# Patient Record
Sex: Female | Born: 1970 | Race: White | Hispanic: No | Smoking: Never smoker
Health system: Southern US, Community
[De-identification: ages and names within clinical notes are randomized; demographics above are authoritative.]

## PROBLEM LIST (undated history)

## (undated) DIAGNOSIS — T7840XA Allergy, unspecified, initial encounter: Secondary | ICD-10-CM

## (undated) DIAGNOSIS — R32 Unspecified urinary incontinence: Secondary | ICD-10-CM

## (undated) DIAGNOSIS — N946 Dysmenorrhea, unspecified: Secondary | ICD-10-CM

## (undated) DIAGNOSIS — Z8659 Personal history of other mental and behavioral disorders: Secondary | ICD-10-CM

## (undated) DIAGNOSIS — D219 Benign neoplasm of connective and other soft tissue, unspecified: Secondary | ICD-10-CM

## (undated) DIAGNOSIS — F419 Anxiety disorder, unspecified: Secondary | ICD-10-CM

## (undated) DIAGNOSIS — E349 Endocrine disorder, unspecified: Secondary | ICD-10-CM

## (undated) DIAGNOSIS — A64 Unspecified sexually transmitted disease: Secondary | ICD-10-CM

## (undated) DIAGNOSIS — D649 Anemia, unspecified: Secondary | ICD-10-CM

## (undated) DIAGNOSIS — I471 Supraventricular tachycardia, unspecified: Secondary | ICD-10-CM

## (undated) DIAGNOSIS — R011 Cardiac murmur, unspecified: Secondary | ICD-10-CM

## (undated) HISTORY — DX: Cardiac murmur, unspecified: R01.1

## (undated) HISTORY — PX: NASAL SINUS SURGERY: SHX719

## (undated) HISTORY — DX: Supraventricular tachycardia: I47.1

## (undated) HISTORY — DX: Endocrine disorder, unspecified: E34.9

## (undated) HISTORY — DX: Anxiety disorder, unspecified: F41.9

## (undated) HISTORY — DX: Personal history of other mental and behavioral disorders: Z86.59

## (undated) HISTORY — DX: Supraventricular tachycardia, unspecified: I47.10

## (undated) HISTORY — PX: COSMETIC SURGERY: SHX468

## (undated) HISTORY — DX: Allergy, unspecified, initial encounter: T78.40XA

## (undated) HISTORY — PX: APPENDECTOMY: SHX54

## (undated) HISTORY — DX: Benign neoplasm of connective and other soft tissue, unspecified: D21.9

## (undated) HISTORY — DX: Dysmenorrhea, unspecified: N94.6

## (undated) HISTORY — PX: BREAST REDUCTION SURGERY: SHX8

## (undated) HISTORY — DX: Anemia, unspecified: D64.9

## (undated) HISTORY — DX: Unspecified urinary incontinence: R32

## (undated) HISTORY — PX: BREAST SURGERY: SHX581

## (undated) HISTORY — DX: Unspecified sexually transmitted disease: A64

## (undated) HISTORY — PX: ABDOMINAL HYSTERECTOMY: SHX81

---

## 2005-02-15 ENCOUNTER — Ambulatory Visit: Payer: Self-pay | Admitting: Internal Medicine

## 2005-07-18 ENCOUNTER — Ambulatory Visit (HOSPITAL_BASED_OUTPATIENT_CLINIC_OR_DEPARTMENT_OTHER): Admission: RE | Admit: 2005-07-18 | Discharge: 2005-07-18 | Payer: Self-pay | Admitting: Plastic Surgery

## 2005-07-18 ENCOUNTER — Encounter (INDEPENDENT_AMBULATORY_CARE_PROVIDER_SITE_OTHER): Payer: Self-pay | Admitting: Specialist

## 2009-03-17 ENCOUNTER — Emergency Department (HOSPITAL_COMMUNITY): Admission: EM | Admit: 2009-03-17 | Discharge: 2009-03-17 | Payer: Self-pay | Admitting: Emergency Medicine

## 2010-01-19 ENCOUNTER — Emergency Department (HOSPITAL_COMMUNITY): Admission: EM | Admit: 2010-01-19 | Discharge: 2010-01-19 | Payer: Self-pay | Admitting: Emergency Medicine

## 2010-06-29 ENCOUNTER — Emergency Department (HOSPITAL_COMMUNITY)
Admission: EM | Admit: 2010-06-29 | Discharge: 2010-06-29 | Disposition: A | Discharge status: Home / Self Care | Payer: Self-pay | Attending: Emergency Medicine | Admitting: Emergency Medicine

## 2010-06-29 ENCOUNTER — Emergency Department (HOSPITAL_COMMUNITY): Payer: Self-pay

## 2010-06-29 DIAGNOSIS — I498 Other specified cardiac arrhythmias: Secondary | ICD-10-CM | POA: Insufficient documentation

## 2010-06-29 DIAGNOSIS — F411 Generalized anxiety disorder: Secondary | ICD-10-CM | POA: Insufficient documentation

## 2010-06-29 DIAGNOSIS — F988 Other specified behavioral and emotional disorders with onset usually occurring in childhood and adolescence: Secondary | ICD-10-CM | POA: Insufficient documentation

## 2010-06-29 LAB — URINALYSIS, ROUTINE W REFLEX MICROSCOPIC
Bilirubin Urine: NEGATIVE
Hgb urine dipstick: NEGATIVE
Protein, ur: NEGATIVE mg/dL
Urine Glucose, Fasting: NEGATIVE mg/dL
Urobilinogen, UA: 0.2 mg/dL (ref 0.0–1.0)

## 2010-06-29 LAB — DIFFERENTIAL
Eosinophils Absolute: 0 10*3/uL (ref 0.0–0.7)
Eosinophils Relative: 0 % (ref 0–5)
Lymphocytes Relative: 19 % (ref 12–46)
Lymphs Abs: 1.4 10*3/uL (ref 0.7–4.0)
Monocytes Absolute: 0.5 10*3/uL (ref 0.1–1.0)
Monocytes Relative: 7 % (ref 3–12)

## 2010-06-29 LAB — COMPREHENSIVE METABOLIC PANEL
ALT: 19 U/L (ref 0–35)
AST: 27 U/L (ref 0–37)
Alkaline Phosphatase: 39 U/L (ref 39–117)
CO2: 21 mEq/L (ref 19–32)
Calcium: 9.3 mg/dL (ref 8.4–10.5)
GFR calc Af Amer: >60 mL/min (ref >60)
GFR calc non Af Amer: >60 mL/min (ref >60)
Potassium: 4.4 mEq/L (ref 3.5–5.1)
Sodium: 139 mEq/L (ref 135–145)
Total Protein: 7.3 g/dL (ref 6.0–8.3)

## 2010-06-29 LAB — POCT CARDIAC MARKERS
CKMB, poc: <1 ng/mL — ABNORMAL LOW (ref 1.0–8.0)
Troponin i, poc: <0.05 ng/mL (ref 0.00–0.09)

## 2010-06-29 LAB — CBC
HCT: 41.1 % (ref 36.0–46.0)
MCH: 32.1 pg (ref 26.0–34.0)
MCHC: 34.5 g/dL (ref 30.0–36.0)
MCV: 93 fL (ref 78.0–100.0)
Platelets: 239 10*3/uL (ref 150–400)
RDW: 12.4 % (ref 11.5–15.5)
WBC: 7.7 10*3/uL (ref 4.0–10.5)

## 2010-06-29 LAB — CK TOTAL AND CKMB (NOT AT ARMC): Total CK: 158 U/L (ref 7–177)

## 2010-07-01 LAB — URINE CULTURE: Culture  Setup Time: 201202180112

## 2010-08-15 LAB — POCT I-STAT, CHEM 8
BUN: 10 mg/dL (ref 6–23)
Calcium, Ion: 1.11 mmol/L — ABNORMAL LOW (ref 1.12–1.32)
Creatinine, Ser: 1 mg/dL (ref 0.4–1.2)
TCO2: 19 mmol/L (ref 0–100)

## 2010-09-28 NOTE — Op Note (Signed)
[name], Lindsey Adams              ACCOUNT NO.:  [id number]   MEDICAL RECORD NO.:  [id number]          PATIENT TYPE:  AMB   LOCATION:  DSC                          FACILITY:  [facility]   PHYSICIAN:  Alfredia Ferguson, M.D.  DATE OF BIRTH:  08/03/1970   DATE OF PROCEDURE:  07/18/2005  DATE OF DISCHARGE:                                 OPERATIVE REPORT   PREOP DIAGNOSIS:  1.  2-mm raised nevus nasal tip.  2.  3-mm raised nevus left lateral nasal wall.  3.  3-mm pigmented nevus right axilla.  4.  5-mm pigmented nevus left inframammary crease.  5.  4-mm flat pigmented nevus left inframammary crease.   POSTOP DIAGNOSIS:  1.  2-mm raised nevus nasal tip.  2.  3-mm raised nevus left lateral nasal wall.  3.  3-mm pigmented nevus right axilla.  4.  5-mm pigmented nevus left inframammary crease.  5.  4-mm flat pigmented nevus left inframammary crease.   OPERATION PERFORMED:  Excision of pigmented nevi x5.   SURGEON:  Dr. Benna Dunks.   ANESTHESIA:  2% Xylocaine 1:100,000 epinephrine.   INDICATIONS FOR SURGERY:  This is a 40 year old woman with multiple  pigmented nevi that she wishes to have removed. She understands she is  trading what she has for permanent potentially unsightly scar. In spite that  she wishes to proceed with surgery.   DESCRIPTION OF SURGERY:  Elliptical skin marks were placed around the lesion  on the nasal tip and the left lateral nasal wall. Local anesthesia using 2%  Xylocaine 1:100,000 epinephrine was infiltrated. The nasal area was prepped  with Betadine and draped with sterile drapes. Elliptical excision of the  lesion of the nasal tip was carried out and the specimen submitted for  pathology. Wound edges were undermined for distance of about a millimeter or  2 in all directions. The incision was closed with interrupted 6-0 nylon  sutures. The lesion of the left lateral nasal wall was excised in an  identical fashion and closed in identical fashion. The area was  cleansed and  dried. Attention was turned to the right axillary lesion. Again local  anesthesia was infiltrated. The area was prepped with Betadine. Lesion was  excised in elliptical fashion and submitted for pathology. The wound was  closed with 6-0 nylon suture. The patient had two lesions in her left  inframammary crease. One was a raised pedunculated pigmented nevus and the  other was a flat irregular darkly pigmented nevus. The lesion that was  pedunculated was shaved at the skin level.  The flat lesion was excised an elliptical fashion down the level  subcutaneous tissue. The incision was then closed with interrupted 6-0 nylon  suture. The patient tolerated these procedures well. Light dressings were  applied. The patient was discharged home in satisfactory condition.      Alfredia Ferguson, M.D.  Electronically Signed     WBB/MEDQ  D:  07/18/2005  T:  07/19/2005  Job:  [id number]

## 2012-01-06 ENCOUNTER — Telehealth: Payer: Self-pay

## 2012-01-06 MED ORDER — VALACYCLOVIR HCL 1 G PO TABS: ORAL_TABLET | ORAL | Status: DC

## 2012-01-06 NOTE — Telephone Encounter (Signed)
Patient notified

## 2012-01-06 NOTE — Telephone Encounter (Signed)
Rx authorized

## 2012-01-06 NOTE — Telephone Encounter (Signed)
Ok for rx x 1?

## 2012-01-06 NOTE — Telephone Encounter (Signed)
Pt has scheduled CPE with Maralyn Sago for 9/18, needs 1 refill of Valtrex called into [facility] on [phone]

## 2012-01-15 ENCOUNTER — Encounter: Payer: Self-pay | Admitting: Physician Assistant

## 2012-01-27 ENCOUNTER — Other Ambulatory Visit: Payer: Self-pay | Admitting: Physician Assistant

## 2012-01-29 ENCOUNTER — Encounter: Payer: Self-pay | Admitting: Physician Assistant

## 2012-02-26 ENCOUNTER — Encounter: Payer: Self-pay | Admitting: Physician Assistant

## 2012-03-10 ENCOUNTER — Other Ambulatory Visit: Payer: Self-pay | Admitting: Physician Assistant

## 2012-03-10 NOTE — Telephone Encounter (Signed)
Chart pulled to PA pool at nurses station [phone]

## 2012-03-25 ENCOUNTER — Encounter: Payer: Self-pay | Admitting: Physician Assistant

## 2012-03-30 ENCOUNTER — Other Ambulatory Visit: Payer: Self-pay | Admitting: Physician Assistant

## 2012-04-22 ENCOUNTER — Encounter: Payer: Self-pay | Admitting: Physician Assistant

## 2012-06-03 ENCOUNTER — Encounter: Payer: Self-pay | Admitting: Physician Assistant

## 2012-06-07 ENCOUNTER — Emergency Department (HOSPITAL_COMMUNITY)
Admission: EM | Admit: 2012-06-07 | Discharge: 2012-06-07 | Disposition: A | Discharge status: Home / Self Care | Payer: Self-pay

## 2013-04-06 ENCOUNTER — Other Ambulatory Visit: Payer: Self-pay | Admitting: Physician Assistant

## 2013-04-06 NOTE — Telephone Encounter (Signed)
Patient insists on renewal of valtrex urgent.   [phone]

## 2013-04-07 ENCOUNTER — Other Ambulatory Visit: Payer: Self-pay | Admitting: Physician Assistant

## 2013-04-12 ENCOUNTER — Other Ambulatory Visit: Payer: Self-pay | Admitting: Physician Assistant

## 2013-04-13 ENCOUNTER — Telehealth: Payer: Self-pay | Admitting: Radiology

## 2013-04-13 NOTE — Telephone Encounter (Signed)
Patient advised she needs office visit for refill on her Valtrex. Patient states she called and was given a phone call about the rx but she is angry she was not advised of need for visit. We have done everything properly, denial sent to phramacy. She is very angry. I have told her I will make sure French Ana knows situation. She also wants Benny Lennert to be aware. She wants French Ana to call her.

## 2013-04-14 ENCOUNTER — Other Ambulatory Visit: Payer: Self-pay | Admitting: Physician Assistant

## 2013-04-15 MED ORDER — VALACYCLOVIR HCL 1 G PO TABS: ORAL_TABLET | ORAL | Status: DC

## 2013-04-15 NOTE — Telephone Encounter (Signed)
I will be happy to send her in a month of medication so that will give her time to have an ov.

## 2013-04-15 NOTE — Telephone Encounter (Signed)
lmom detail with note that she will need office visit per sarah before she run out.

## 2013-06-02 ENCOUNTER — Telehealth: Payer: Self-pay

## 2013-06-02 NOTE — Telephone Encounter (Signed)
PT WOULD LIKE A LIST OF HER MEDICINE SHE TAKES SO IT CAN BE REQUESTED FROM ANOTHER DR, WANTED TO MAKE SURE ADDERALL IS ON THE LIST PLEASE CALL [phone]

## 2013-06-04 NOTE — Telephone Encounter (Signed)
LM for pt to contact primary provider. We do not have a complete list of medications. We have never prescribed Adderall.

## 2013-12-11 ENCOUNTER — Telehealth: Payer: Self-pay

## 2013-12-11 NOTE — Telephone Encounter (Signed)
The pt called and wanted [name] to send her in some flagyl for BV. The pt states that she went to another urgent care in [place] and was diagnosed with Bv and got the meds, but wants Weber to send in another script in case she gets it again. If this can be done pt wants the script sent into [facility]. Please call the pt and advise the pt number is [phone].

## 2013-12-11 NOTE — Telephone Encounter (Signed)
We have not seen the patient in 3 years and I cannot send in this Rx I am sorry.

## 2013-12-13 NOTE — Telephone Encounter (Signed)
Lm unable to process request. Pt needs to RTC.

## 2014-08-08 ENCOUNTER — Ambulatory Visit (INDEPENDENT_AMBULATORY_CARE_PROVIDER_SITE_OTHER): Payer: Self-pay | Admitting: Physician Assistant

## 2014-08-08 DIAGNOSIS — F988 Other specified behavioral and emotional disorders with onset usually occurring in childhood and adolescence: Secondary | ICD-10-CM

## 2014-08-08 DIAGNOSIS — Z130 Encounter for screening for diseases of the blood and blood-forming organs and certain disorders involving the immune mechanism: Secondary | ICD-10-CM

## 2014-08-08 DIAGNOSIS — Z1329 Encounter for screening for other suspected endocrine disorder: Secondary | ICD-10-CM

## 2014-08-08 DIAGNOSIS — Z13228 Encounter for screening for other metabolic disorders: Secondary | ICD-10-CM

## 2014-08-08 DIAGNOSIS — Z3041 Encounter for surveillance of contraceptive pills: Secondary | ICD-10-CM

## 2014-08-08 DIAGNOSIS — Z13220 Encounter for screening for lipoid disorders: Secondary | ICD-10-CM

## 2014-08-08 DIAGNOSIS — Z114 Encounter for screening for human immunodeficiency virus [HIV]: Secondary | ICD-10-CM

## 2014-08-08 DIAGNOSIS — I471 Supraventricular tachycardia, unspecified: Secondary | ICD-10-CM

## 2014-08-08 DIAGNOSIS — A6000 Herpesviral infection of urogenital system, unspecified: Secondary | ICD-10-CM

## 2014-08-08 DIAGNOSIS — R21 Rash and other nonspecific skin eruption: Secondary | ICD-10-CM

## 2014-08-08 DIAGNOSIS — F909 Attention-deficit hyperactivity disorder, unspecified type: Secondary | ICD-10-CM

## 2014-08-08 DIAGNOSIS — Z113 Encounter for screening for infections with a predominantly sexual mode of transmission: Secondary | ICD-10-CM

## 2014-08-08 DIAGNOSIS — F411 Generalized anxiety disorder: Secondary | ICD-10-CM

## 2014-08-08 MED ORDER — ATENOLOL 25 MG PO TABS: 25.0000 mg | ORAL_TABLET | Freq: Every day | ORAL | Status: DC

## 2014-08-08 MED ORDER — VALACYCLOVIR HCL 1 G PO TABS: ORAL_TABLET | ORAL | Status: DC

## 2014-08-08 MED ORDER — LEVONORGESTREL-ETHINYL ESTRAD 0.1-20 MG-MCG PO TABS: 1.0000 | ORAL_TABLET | Freq: Every day | ORAL | Status: DC

## 2014-08-08 MED ORDER — TRIAMCINOLONE ACETONIDE 0.5 % EX CREA: 1.0000 "application " | TOPICAL_CREAM | Freq: Two times a day (BID) | CUTANEOUS | Status: DC

## 2014-08-08 MED ORDER — DESOGESTREL-ETHINYL ESTRADIOL 0.15-30 MG-MCG PO TABS: ORAL_TABLET | ORAL | Status: DC

## 2014-08-08 MED ORDER — HYDROCORTISONE 2.5 % RE CREA: 1.0000 "application " | TOPICAL_CREAM | Freq: Two times a day (BID) | RECTAL | Status: DC

## 2014-08-08 NOTE — Patient Instructions (Signed)
I will contact you with your lab results as soon as they are available.   If you have not heard from me in 2 weeks, please contact me.  The fastest way to get your results is to register for My Chart (see the instructions on the last page of this printout).   

## 2014-08-08 NOTE — Progress Notes (Signed)
Subjective:    Patient ID: Lindsey Adams, female    DOB: 1970/10/10, 44 y.o.   MRN: [id number]  HPI Pt presents for medication refill.  She is back home currently to help with her mother whose health is failing.  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  She is doing well.  She needs refills on her OCP (she would like to try something different than Apri at this time) and she would like to have STD testing.  She likes to have it yearly to make sure everything is ok.  She is also having a rash that is very itchy on her back for the last 2 weeks started after a bad sunburn to her back while she was doing an intestinal cleanse.  She has done nothing for the rash but would like it checked because it is starting to bother her.  She has also had several episodes (not currently) where she gets a painful area near her anus - it feels different than her genital herpes and she has tried her Valtrex but it does help with the burning and pain - she also does not get the same prodromal sensation as she does with her HSV.  She uses wet wipes with every BM but she does admit to wiping a lot.  She does not weat underwear and wears tight pants but she does not feel like her pants are rubbing her at the area she gets the pain.  The rash/pain goes on for a few days.  It feel different than her fissures and the diltizem cream makes the area burn - it does not hurt with having a BM but rather hurts when there is pressure on the area.  She does not engage in anal sex.  She Dr Toy Care for her ADD and anxiety.   Review of Systems  Constitutional: Negative.   HENT: Negative.   Gastrointestinal: Negative for diarrhea and constipation.  Genitourinary: Negative for dysuria, vaginal bleeding, vaginal discharge and menstrual problem.  Skin: Positive for rash.  Neurological: Negative.    Patient Active Problem List   Diagnosis Date Noted  . SVT (supraventricular tachycardia)  08/08/2014  . ADD (attention deficit disorder) 08/08/2014  . Generalized anxiety disorder 08/08/2014   Prior to Admission medications   Medication Sig Start Date End Date Taking? Authorizing Provider  ALPRAZolam Duanne Moron) 0.5 MG tablet Take 0.5 mg by mouth 3 (three) times daily as needed for anxiety.   Yes Historical Provider, MD  amphetamine-dextroamphetamine (ADDERALL) 20 MG tablet Take 20 mg by mouth 3 (three) times daily.   Yes Historical Provider, MD  atenolol (TENORMIN) 25 MG tablet Take 1 tablet (25 mg total) by mouth daily. 08/08/14  Yes Mancel Bale, PA-C  valACYclovir (VALTREX) 1000 MG tablet Take 1/2 tablet daily for suppression, then 1/2 tablet twice daily for 3 days as needed for outbreaks 08/08/14  Yes Mancel Bale, PA-C  desogestrel-ethinyl estradiol (APRI) 0.15-30 MG-MCG tablet TAKE 1 TABLET BY MOUTH EVERY DAY AS DIRECTED. 08/08/14   Mancel Bale, PA-C   Allergies  Allergen Reactions  . Sulfa Antibiotics Nausea Only    Medications, allergies, past medical history, surgical history, family history, social history and problem list reviewed and updated.      Objective:   Physical Exam  Constitutional: She is oriented to person, place, and time. She appears well-developed and well-nourished.  BP 114/72 mmHg  Pulse 90  Temp(Src) 97.9 F (36.6 C) (Oral)  Resp 16  Ht 5\' 5"  (1.651 m)  Wt 138 lb 4 oz (62.71 kg)  BMI 23.01 kg/m2  SpO2 100%  LMP 07/26/2014   HENT:  Head: Normocephalic and atraumatic.  Right Ear: External ear normal.  Left Ear: External ear normal.  Eyes: Conjunctivae are normal.  Cardiovascular: Normal rate, regular rhythm and normal heart sounds.   No murmur heard. Pulmonary/Chest: Effort normal and breath sounds normal. She has no wheezes.  Genitourinary: There is no rash, tenderness, lesion or injury on the right labia. There is no rash, tenderness, lesion or injury on the left labia. Cervix exhibits no motion tenderness, no discharge and no  friability.  Bilateral nipple rings.  Clitoris ring present.  Musculoskeletal: Normal range of motion.  Neurological: She is alert and oriented to person, place, and time.  Skin: Skin is warm and dry. Rash noted.  Upper back - signs of peeling skin with macules some of which are unroofed and irritated - there is no signs of 2nd infection.  Anus - just adjacent to the anus there are small (3-79mm) areas of raw tissue without erythema, no drainage from the anus.  There is a raised non-erythematous area where the patient states she gets this painful area that is not part of her anal tissue but at the 7 o'clock position of her perineum without any signs of infection.  Psychiatric: She has a normal mood and affect. Her behavior is normal. Judgment and thought content normal.  Vitals reviewed.     Assessment & Plan:  Family planning, BCP (birth control pills) maintenance - Plan: , levonorgestrel-ethinyl estradiol (AVIANE) 0.1-20 MG-MCG tablet  Rash  Most likely raw from excessive wiping - she will try destin for barrier protection and then the cortisone cream to help it heal- Plan: hydrocortisone (ANUSOL-HC) 2.5 % rectal cream  Rash of back - appears like a contact dermatitis - expect a sun irritatant due to her recent bad sunburnPlan: triamcinolone cream (KENALOG) 0.5 %  Screening for metabolic disorder - Plan: COMPLETE METABOLIC PANEL WITH GFR  Screening for HIV (human immunodeficiency virus) - Plan: HIV antibody  Screening for thyroid disorder - Plan: TSH  Screening cholesterol level - Plan: Lipid panel  Screening for deficiency anemia - Plan: CBC with Differential/Platelet  Screening for STD (sexually transmitted disease) - Plan: POCT urinalysis dipstick, Hepatitis B surface antigen, Hepatitis C Ab Reflex HCV RNA, QUANT, RPR, GC/Chlamydia Probe Amp  SVT (supraventricular tachycardia) - Plan: atenolol (TENORMIN) 25 MG tablet  ADD (attention deficit disorder) - gets medication from Dr  Toy Care  Generalized anxiety disorder - gets her medications from Dr Toy Care  Genital HSV - Plan: valACYclovir (VALTREX) 1000 MG tablet  SVT (supraventricular tachycardia), Inactive - Plan: atenolol (TENORMIN) 25 MG tablet   [name] PA-C  [facility] 08/08/2014 9:37 PM

## 2014-08-09 ENCOUNTER — Encounter: Payer: Self-pay | Admitting: Physician Assistant

## 2014-08-09 LAB — CBC WITH DIFFERENTIAL/PLATELET
BASOS ABS: 0.1 10*3/uL (ref 0.0–0.1)
Basophils Relative: 2 % — ABNORMAL HIGH (ref 0–1)
EOS PCT: 10 % — AB (ref 0–5)
Eosinophils Absolute: 0.6 10*3/uL (ref 0.0–0.7)
HEMATOCRIT: 31.4 % — AB (ref 36.0–46.0)
HEMOGLOBIN: 10 g/dL — AB (ref 12.0–15.0)
LYMPHS ABS: 2.3 10*3/uL (ref 0.7–4.0)
LYMPHS PCT: 36 % (ref 12–46)
MCH: 24.9 pg — AB (ref 26.0–34.0)
MCHC: 31.8 g/dL (ref 30.0–36.0)
MCV: 78.3 fL (ref 78.0–100.0)
MPV: 9 fL (ref 8.6–12.4)
Monocytes Absolute: 0.5 10*3/uL (ref 0.1–1.0)
Monocytes Relative: 8 % (ref 3–12)
Neutro Abs: 2.8 10*3/uL (ref 1.7–7.7)
Neutrophils Relative %: 44 % (ref 43–77)
Platelets: 381 10*3/uL (ref 150–400)
RBC: 4.01 MIL/uL (ref 3.87–5.11)
RDW: 17 % — ABNORMAL HIGH (ref 11.5–15.5)
WBC: 6.4 10*3/uL (ref 4.0–10.5)

## 2014-08-09 LAB — COMPLETE METABOLIC PANEL WITH GFR
ALT: 14 U/L (ref 0–35)
AST: 17 U/L (ref 0–37)
Albumin: 4.6 g/dL (ref 3.5–5.2)
Alkaline Phosphatase: 44 U/L (ref 39–117)
BUN: 4 mg/dL — ABNORMAL LOW (ref 6–23)
CO2: 28 mEq/L (ref 19–32)
Calcium: 9.9 mg/dL (ref 8.4–10.5)
Chloride: 101 mEq/L (ref 96–112)
Creat: 0.71 mg/dL (ref 0.50–1.10)
GFR, Est African American: >89 mL/min
GFR, Est Non African American: >89 mL/min
Glucose, Bld: 61 mg/dL — ABNORMAL LOW (ref 70–99)
Potassium: 3.7 mEq/L (ref 3.5–5.3)
Sodium: 141 mEq/L (ref 135–145)
Total Bilirubin: 0.6 mg/dL (ref 0.2–1.2)
Total Protein: 7.6 g/dL (ref 6.0–8.3)

## 2014-08-09 LAB — LIPID PANEL
Cholesterol: 228 mg/dL — ABNORMAL HIGH (ref 0–200)
HDL: 123 mg/dL (ref >=46)
LDL Cholesterol: 91 mg/dL (ref 0–99)
Total CHOL/HDL Ratio: 1.9 Ratio
Triglycerides: 71 mg/dL (ref <150)
VLDL: 14 mg/dL (ref 0–40)

## 2014-08-09 LAB — HEPATITIS C ANTIBODY: HCV Ab: NEGATIVE

## 2014-08-09 LAB — TSH: TSH: 1.401 u[IU]/mL (ref 0.350–4.500)

## 2014-08-09 LAB — RPR

## 2014-08-09 LAB — HEPATITIS B SURFACE ANTIGEN: Hepatitis B Surface Ag: NEGATIVE

## 2014-08-09 LAB — HIV ANTIBODY (ROUTINE TESTING W REFLEX): HIV 1&2 Ab, 4th Generation: NONREACTIVE

## 2014-08-10 LAB — GC/CHLAMYDIA PROBE AMP
CT PROBE, AMP APTIMA: NEGATIVE
GC PROBE AMP APTIMA: NEGATIVE

## 2014-09-23 ENCOUNTER — Telehealth: Payer: Self-pay

## 2014-09-23 NOTE — Telephone Encounter (Signed)
Patient wants to speak with Bernestine Amass and states this is a serious emergency. Patient states that she has been bleeding a lot from switching birthcontrol and now she has bv. I asked someone from clinical if it was possible to get BV from switching medication and they said no. Patient thinks it could be from a sexual partner but she is sure that she has it. Please call! [phone]

## 2014-09-24 NOTE — Telephone Encounter (Signed)
Pt states she is out of state and travels all the time and cannot come in. She wants to know if you will call her in flagyl with one refill.  She keeps getting these infections after her menstrual cycle.  Vladimir Faster in [place]

## 2014-09-26 NOTE — Telephone Encounter (Signed)
I have pended the medications. Will you clarify the pharmacy please and then send in the Rx.

## 2014-10-05 NOTE — Telephone Encounter (Signed)
Can we try and get in contact with this patient regarding this please.

## 2014-10-05 NOTE — Telephone Encounter (Signed)
LEFT A MESSAGE FOR PATIENT TO CALL BACK WE NEED A PHARMACY TO SEND PRESCRIPTION TO.

## 2014-10-06 MED ORDER — METRONIDAZOLE 500 MG PO TABS: 500.0000 mg | ORAL_TABLET | Freq: Two times a day (BID) | ORAL | Status: DC

## 2014-10-06 NOTE — Telephone Encounter (Signed)
Pt is needing the pill form of flagyl sent to [facility] in [place], [place]

## 2014-10-06 NOTE — Telephone Encounter (Signed)
[facility] Rx sent.

## 2015-02-15 ENCOUNTER — Telehealth: Payer: Self-pay

## 2015-02-15 NOTE — Telephone Encounter (Signed)
Pt is needing a rx for diflucan

## 2015-02-16 MED ORDER — FLUCONAZOLE 150 MG PO TABS: 150.0000 mg | ORAL_TABLET | Freq: Once | ORAL | Status: DC

## 2015-02-16 NOTE — Telephone Encounter (Signed)
Please talk to the patient - if she gets this every time she takes Flagyl - maybe we need to switch to metrogel.  If Diflcan has worked for her in the past I am happy to write it but in my experience Thrush sometimes needs a longer treatment than diflucan pills.  I have sent her in 2 pills unless you get other information I need to know.

## 2015-02-16 NOTE — Telephone Encounter (Signed)
Pt states that this does happen every time she takes this med.  She stated that she will try the diflucan.  Lindsey Adams

## 2015-02-16 NOTE — Telephone Encounter (Signed)
Pt called back to check status. She reports that she is back in town now and will set up appt to see Judson Roch. In the meantime though, she has taken the Flagyl that Judson Roch Rxs for her and it causes yeast infections because it is so strong. She has thrush and "her mouth is almost bleeding it is so bad". Her RF for the diflucan has expired and she really needs a couple of tablets to clear up this infection and carry her until she can get in to see Judson Roch. Please advise.

## 2015-04-22 ENCOUNTER — Other Ambulatory Visit: Payer: Self-pay | Admitting: Physician Assistant

## 2015-04-22 NOTE — Telephone Encounter (Signed)
Patient states that she really needs Diflucan immediately because she is taking an antibiotic and she has a yeast infection. Patient also states that she thinks Judson Roch wrote her enough refills to last until the end of the year. She is not sure why the pharmacy doesn't have this.   [phone]

## 2015-06-08 ENCOUNTER — Telehealth: Payer: Self-pay

## 2015-06-08 NOTE — Telephone Encounter (Signed)
Patient was seen in 07/2014, wanted a change in her script from [facility] for OCP. She was started on Aviane but reports that the pharmacy only started filling this script 2 months ago which is when her symptoms started. She is now going to go back to [place] and rtc when PA-Sarah is here on Tuesday if her problem persists.

## 2015-06-08 NOTE — Telephone Encounter (Signed)
Pt would like a CB at [phone]. She has been bleeding for 2 months straight. She stated just only the tip of her tampon gets red. She feels this is because of her birth control pills. Please advise.

## 2015-06-08 NOTE — Telephone Encounter (Signed)
Assessment & Plan:  Family planning, BCP (birth control pills) maintenance - Plan: , levonorgestrel-ethinyl estradiol (AVIANE) 0.1-20 MG-MCG tablet        Please advise.

## 2015-08-27 ENCOUNTER — Emergency Department (HOSPITAL_COMMUNITY)
Admission: EM | Admit: 2015-08-27 | Discharge: 2015-08-27 | Disposition: A | Discharge status: Home / Self Care | Payer: Self-pay | Attending: Emergency Medicine | Admitting: Emergency Medicine

## 2015-08-27 ENCOUNTER — Encounter (HOSPITAL_COMMUNITY): Payer: Self-pay | Admitting: Emergency Medicine

## 2015-08-27 DIAGNOSIS — I471 Supraventricular tachycardia: Secondary | ICD-10-CM | POA: Insufficient documentation

## 2015-08-27 DIAGNOSIS — Z792 Long term (current) use of antibiotics: Secondary | ICD-10-CM | POA: Insufficient documentation

## 2015-08-27 DIAGNOSIS — R002 Palpitations: Secondary | ICD-10-CM

## 2015-08-27 DIAGNOSIS — R011 Cardiac murmur, unspecified: Secondary | ICD-10-CM | POA: Insufficient documentation

## 2015-08-27 DIAGNOSIS — F419 Anxiety disorder, unspecified: Secondary | ICD-10-CM | POA: Insufficient documentation

## 2015-08-27 DIAGNOSIS — Z79899 Other long term (current) drug therapy: Secondary | ICD-10-CM | POA: Insufficient documentation

## 2015-08-27 DIAGNOSIS — R55 Syncope and collapse: Secondary | ICD-10-CM | POA: Insufficient documentation

## 2015-08-27 DIAGNOSIS — Z7952 Long term (current) use of systemic steroids: Secondary | ICD-10-CM | POA: Insufficient documentation

## 2015-08-27 LAB — CBC WITH DIFFERENTIAL/PLATELET
BASOS ABS: 0 10*3/uL (ref 0.0–0.1)
Basophils Relative: 0 %
Eosinophils Absolute: 0.1 10*3/uL (ref 0.0–0.7)
Eosinophils Relative: 1 %
HEMATOCRIT: 30.6 % — AB (ref 36.0–46.0)
Hemoglobin: 9.6 g/dL — ABNORMAL LOW (ref 12.0–15.0)
LYMPHS ABS: 1.3 10*3/uL (ref 0.7–4.0)
LYMPHS PCT: 16 %
MCH: 26.4 pg (ref 26.0–34.0)
MCHC: 31.4 g/dL (ref 30.0–36.0)
MCV: 84.3 fL (ref 78.0–100.0)
MONO ABS: 0.4 10*3/uL (ref 0.1–1.0)
Monocytes Relative: 4 %
NEUTROS ABS: 6.5 10*3/uL (ref 1.7–7.7)
Neutrophils Relative %: 79 %
Platelets: 229 10*3/uL (ref 150–400)
RBC: 3.63 MIL/uL — ABNORMAL LOW (ref 3.87–5.11)
RDW: 17.4 % — AB (ref 11.5–15.5)
WBC: 8.3 10*3/uL (ref 4.0–10.5)

## 2015-08-27 LAB — TSH: TSH: 0.958 u[IU]/mL (ref 0.350–4.500)

## 2015-08-27 LAB — COMPREHENSIVE METABOLIC PANEL
ALBUMIN: 3.1 g/dL — AB (ref 3.5–5.0)
ALT: 23 U/L (ref 14–54)
AST: 45 U/L — AB (ref 15–41)
Alkaline Phosphatase: 28 U/L — ABNORMAL LOW (ref 38–126)
Anion gap: 11 (ref 5–15)
BUN: 9 mg/dL (ref 6–20)
CHLORIDE: 105 mmol/L (ref 101–111)
CO2: 20 mmol/L — ABNORMAL LOW (ref 22–32)
Calcium: 8.3 mg/dL — ABNORMAL LOW (ref 8.9–10.3)
Creatinine, Ser: 0.93 mg/dL (ref 0.44–1.00)
GFR calc Af Amer: >60 mL/min (ref >60)
GFR calc non Af Amer: >60 mL/min (ref >60)
GLUCOSE: 107 mg/dL — AB (ref 65–99)
POTASSIUM: 4.9 mmol/L (ref 3.5–5.1)
Sodium: 136 mmol/L (ref 135–145)
Total Bilirubin: 0.6 mg/dL (ref 0.3–1.2)
Total Protein: 6.2 g/dL — ABNORMAL LOW (ref 6.5–8.1)

## 2015-08-27 LAB — PHOSPHORUS: PHOSPHORUS: 2.8 mg/dL (ref 2.5–4.6)

## 2015-08-27 LAB — I-STAT TROPONIN, ED: Troponin i, poc: 0.01 ng/mL (ref 0.00–0.08)

## 2015-08-27 LAB — MAGNESIUM: MAGNESIUM: 1.8 mg/dL (ref 1.7–2.4)

## 2015-08-27 MED ORDER — SODIUM CHLORIDE 0.9 % IV BOLUS (SEPSIS)
500.0000 mL | Freq: Once | INTRAVENOUS | Status: AC
  Administered 2015-08-27: 500 mL via INTRAVENOUS

## 2015-08-27 NOTE — ED Notes (Signed)
MD at bedside. 

## 2015-08-27 NOTE — ED Provider Notes (Signed)
CSN: [id number]     Arrival date & time 08/27/15  1404 History   First MD Initiated Contact with Patient 08/27/15 1413     Chief Complaint  Patient presents with  . Tachycardia     (Consider location/radiation/quality/duration/timing/severity/associated sxs/prior Treatment) Patient is a 45 y.o. female presenting with palpitations.  Palpitations Palpitations quality:  Fast Onset quality:  At rest (woke up with it) Duration: awoke at 0730. Timing:  Sporadic Progression:  Resolved Chronicity:  Recurrent Context: stimulant use (takes adderol)   Context: not anxiety, not caffeine, not dehydration, not exercise, not hyperventilation, not illicit drugs and not nicotine   Relieved by:  Nothing Worsened by:  Stimulants Ineffective treatments:  Beta blockers, breathing exercises, bed rest and Valsalva Associated symptoms: near-syncope   Associated symptoms: no back pain, no chest pain, no chest pressure, no cough, no diaphoresis, no dizziness, no hemoptysis, no leg pain, no lower extremity edema, no nausea, no numbness, no orthopnea, no shortness of breath, no vomiting and no weakness   Risk factors: no diabetes mellitus, no heart disease, no hx of atrial fibrillation, no hx of DVT, no hx of thyroid disease, no hyperthyroidism, no OTC sinus medications and no stress   Risk factors comment:  Hx of SVT, has explored the concept of ablation with a cardiologist, however has had financial difficulties and has not done it yet.     Past Medical History  Diagnosis Date  . Allergy   . Anxiety   . Heart murmur    Past Surgical History  Procedure Laterality Date  . Appendectomy    . Cosmetic surgery    . Cesarean section    . Breast enhancement surgery Bilateral    No family history on file. Social History  Substance Use Topics  . Smoking status: Never Smoker   . Smokeless tobacco: Never Used  . Alcohol Use: 0.0 oz/week    0 Standard drinks or equivalent per week     Comment: 1-2 night    OB History    No data available     Review of Systems  Constitutional: Positive for activity change. Negative for fever, chills and diaphoresis.  HENT: Negative for congestion, rhinorrhea, sinus pressure and sneezing.   Respiratory: Negative for cough, hemoptysis, chest tightness, shortness of breath and wheezing.   Cardiovascular: Positive for palpitations and near-syncope. Negative for chest pain, orthopnea and leg swelling.  Gastrointestinal: Negative for nausea, vomiting, abdominal pain and diarrhea.  Musculoskeletal: Negative for back pain and neck pain.  Skin: Negative for rash and wound.  Neurological: Positive for syncope (near-syncope) and light-headedness. Negative for dizziness, weakness, numbness and headaches.  Psychiatric/Behavioral: The patient is not nervous/anxious.   All other systems reviewed and are negative.     Allergies  Sulfa antibiotics; Codeine; and Hydrocodone  Home Medications   Prior to Admission medications   Medication Sig Start Date End Date Taking? Authorizing Provider  ALPRAZolam Duanne Moron) 0.5 MG tablet Take 0.5 mg by mouth 3 (three) times daily as needed for anxiety.    Historical Provider, MD  amphetamine-dextroamphetamine (ADDERALL) 20 MG tablet Take 20 mg by mouth 3 (three) times daily.    Historical Provider, MD  atenolol (TENORMIN) 25 MG tablet Take 1 tablet (25 mg total) by mouth daily. 08/08/14   Mancel Bale, PA-C  desogestrel-ethinyl estradiol (APRI) 0.15-30 MG-MCG tablet TAKE 1 TABLET BY MOUTH EVERY DAY AS DIRECTED. 08/08/14   Mancel Bale, PA-C  fluconazole (DIFLUCAN) 150 MG tablet Take 1 tablet (150 mg  total) by mouth once. May repeat in 1 week if needed. 02/16/15   Mancel Bale, PA-C  hydrocortisone (ANUSOL-HC) 2.5 % rectal cream Place 1 application rectally 2 (two) times daily. 08/08/14   Mancel Bale, PA-C  levonorgestrel-ethinyl estradiol (AVIANE) 0.1-20 MG-MCG tablet Take 1 tablet by mouth daily. 08/08/14   Mancel Bale, PA-C   metroNIDAZOLE (FLAGYL) 500 MG tablet Take 1 tablet (500 mg total) by mouth 2 (two) times daily. 10/06/14   Mancel Bale, PA-C  triamcinolone cream (KENALOG) 0.5 % Apply 1 application topically 2 (two) times daily. 08/08/14   Mancel Bale, PA-C  valACYclovir (VALTREX) 1000 MG tablet Take 1/2 tablet daily for suppression, then 1/2 tablet twice daily for 3 days as needed for outbreaks 08/08/14   Mancel Bale, PA-C   BP 104/61 mmHg  Pulse 75  Temp(Src) 98.5 F (36.9 C) (Oral)  Resp 20  Ht 5\' 6"  (1.676 m)  Wt 62.596 kg  BMI 22.28 kg/m2  SpO2 99% Physical Exam  Constitutional: She is oriented to person, place, and time. She appears well-developed and well-nourished. No distress.  HENT:  Head: Normocephalic and atraumatic.  Nose: Nose normal.  Mouth/Throat: Oropharynx is clear and moist.  Eyes: Conjunctivae and EOM are normal. Pupils are equal, round, and reactive to light.  Neck: Normal range of motion. Neck supple.  Cardiovascular: Normal rate, regular rhythm, normal heart sounds and intact distal pulses.   Pulmonary/Chest: Effort normal and breath sounds normal. She exhibits no tenderness.  Abdominal: Soft. Bowel sounds are normal. There is no tenderness.  Musculoskeletal: She exhibits no edema or tenderness.  Neurological: She is alert and oriented to person, place, and time.  Skin: Skin is warm and dry. No rash noted. She is not diaphoretic.  Nursing note and vitals reviewed.   ED Course  Procedures (including critical care time) Labs Review Labs Reviewed  CBC WITH DIFFERENTIAL/PLATELET - Abnormal; Notable for the following:    RBC 3.63 (*)    Hemoglobin 9.6 (*)    HCT 30.6 (*)    RDW 17.4 (*)    All other components within normal limits  COMPREHENSIVE METABOLIC PANEL - Abnormal; Notable for the following:    CO2 20 (*)    Glucose, Bld 107 (*)    Calcium 8.3 (*)    Total Protein 6.2 (*)    Albumin 3.1 (*)    AST 45 (*)    Alkaline Phosphatase 28 (*)    All other  components within normal limits  MAGNESIUM  PHOSPHORUS  TSH  I-STAT TROPOININ, ED    Imaging Review No results found. I have personally reviewed and evaluated these images and lab results as part of my medical decision-making.   EKG Interpretation   Date/Time:  Sunday August 27 2015 14:44:07 EDT Ventricular Rate:  81 PR Interval:  129 QRS Duration: 74 QT Interval:  418 QTC Calculation: 485 R Axis:   90 Text Interpretation:  Normal sinus rhythm Normal ECG Confirmed by RAY MD,  Andee Poles [id number]) on 08/27/2015 2:48:33 PM      MDM  45 y.o. with a history of intermittent SVT on PRN (though Rx'd daily) atenolol 25mg  presents to the emergency department after she awoke at 07:30 with palpitation symptoms identical to prior. She had no associated chest pain, nausea, vomiting, chest pressure, diaphoresis.  She took 2 atenolol pills and had no improvement in her symptoms. This medication precipitated lightheadedness and near-syncope for several hours and around 1:30PM she called EMS.  On arrival they found her to be in SVT with hypotension 80/40. They gave 12mg  adenosine and IV fluid converted back to NSR with a rate in the 70's-80's recovering a normal blood pressure of 100's/50's. She then remained asymptomatic throughout transport and on arrival remains in no acute distress with vital signs stable. On review of EKGs performed in the field show SVT with a rate of 157 with no significant ST or T-wave abnormalities suggestive of ischemia. Repeat EKG was performed here which showed NSR with normal intervals. Physical exam unremarkable, as above. Labs are drawn and showed anemia with hemoglobin of 9.6, similar to prior, negative troponin, no leukocytosis, normal electrolytes. Givn no associated chest pain, negative screening troponin, and no hx of ACS, do not feel that a delta troponin is necessary in this case. She remained HDS in the ED and had no further arrhythmias noted. She was recommended to follow  up with her cardiologist to further discuss her medication regimen in an effort to better prevent SVT episodes like she had today. She was also recommended to be careful with her adderol and try to avoid any other SVT precipitants. This plan was discussed with the patient at the patient at the bedside and she stated both understanding and agreement.   Final diagnoses:  SVT (supraventricular tachycardia) ([place])  Intermittent palpitations       Zenovia Jarred, DO 08/27/15 [place], MD 08/27/15 [phone]

## 2015-08-27 NOTE — ED Provider Notes (Signed)
MSE was initiated and I personally evaluated the patient and placed orders (if any) at  2:46 PM on August 27, 2015.  The patient appears stable so that the remainder of the MSE may be completed by another provider.  Patient has a history of SVT, she woke up at 7:30 this morning and did not feel well, EMS was called. Upon arrival heart rate was 160, she was hypotensive at 80/40. She was converted with 12 of adenosine.  She is well appearing upon arrival to the ER, labs, EKG and monitoring were ordered.  Stable to be seen by oncoming provider.  Filed Vitals:   08/27/15 1423 08/27/15 1428 08/27/15 1429 08/27/15 1447  BP: 95/81 100/56 100/56   Pulse: 78 78 77   Temp:   98.5 F (36.9 C)   TempSrc:   Oral   Resp: 18  20   Height:    5\' 6"  (1.676 m)  Weight:    62.596 kg  SpO2: 99% 100% 100%      2:49 PM  Delsa Grana, PA-C   Delsa Grana, PA-C 08/27/15 1449  Pattricia Boss, MD 08/29/15 1616

## 2015-08-27 NOTE — ED Notes (Signed)
Per [facility], hx of SVT, woke up at 730 didn't feel right. Pt 160 with ems, BP 80/40. 20 L forearm. Given 12 adenosine. Converted to NSR rate of 74. Pt denies pain, pt states "i feel better, i can go home now".

## 2016-01-02 ENCOUNTER — Other Ambulatory Visit: Payer: Self-pay | Admitting: Physician Assistant

## 2016-01-02 DIAGNOSIS — Z3041 Encounter for surveillance of contraceptive pills: Secondary | ICD-10-CM

## 2016-01-02 DIAGNOSIS — A6000 Herpesviral infection of urogenital system, unspecified: Secondary | ICD-10-CM

## 2016-01-05 ENCOUNTER — Other Ambulatory Visit: Payer: Self-pay | Admitting: Physician Assistant

## 2016-01-05 DIAGNOSIS — A6000 Herpesviral infection of urogenital system, unspecified: Secondary | ICD-10-CM

## 2016-01-05 DIAGNOSIS — Z3041 Encounter for surveillance of contraceptive pills: Secondary | ICD-10-CM

## 2016-01-06 ENCOUNTER — Other Ambulatory Visit: Payer: Self-pay | Admitting: *Deleted

## 2016-01-06 ENCOUNTER — Telehealth: Payer: Self-pay | Admitting: *Deleted

## 2016-01-06 DIAGNOSIS — A6000 Herpesviral infection of urogenital system, unspecified: Secondary | ICD-10-CM

## 2016-01-06 MED ORDER — VALACYCLOVIR HCL 1 G PO TABS: ORAL_TABLET | ORAL | 0 refills | Status: DC

## 2016-01-06 NOTE — Telephone Encounter (Signed)
Patient was advised needs to come in for follow up before prescriptions run out.  Patient understood

## 2016-05-31 ENCOUNTER — Telehealth: Payer: Self-pay | Admitting: Physician Assistant

## 2016-05-31 DIAGNOSIS — A6000 Herpesviral infection of urogenital system, unspecified: Secondary | ICD-10-CM

## 2016-05-31 MED ORDER — VALACYCLOVIR HCL 1 G PO TABS: ORAL_TABLET | ORAL | 12 refills | Status: DC

## 2016-05-31 NOTE — Telephone Encounter (Signed)
Pt needed refills on her valtrex.

## 2016-09-21 ENCOUNTER — Telehealth: Payer: Self-pay | Admitting: Physician Assistant

## 2016-09-24 ENCOUNTER — Encounter: Payer: Self-pay | Admitting: Physician Assistant

## 2016-09-24 ENCOUNTER — Ambulatory Visit (INDEPENDENT_AMBULATORY_CARE_PROVIDER_SITE_OTHER): Payer: Self-pay | Admitting: Physician Assistant

## 2016-09-24 VITALS — BP 140/89 | HR 93 | Temp 98.0°F | Resp 18 | Ht 65.55 in | Wt 132.6 lb

## 2016-09-24 DIAGNOSIS — N926 Irregular menstruation, unspecified: Secondary | ICD-10-CM

## 2016-09-24 DIAGNOSIS — Z3041 Encounter for surveillance of contraceptive pills: Secondary | ICD-10-CM

## 2016-09-24 MED ORDER — NORETHINDRONE-ETH ESTRADIOL 0.5-35 MG-MCG PO TABS: 1.0000 | ORAL_TABLET | Freq: Every day | ORAL | 4 refills | Status: DC

## 2016-09-24 NOTE — Patient Instructions (Signed)
     IF you received an x-ray today, you will receive an invoice from [facility]. Please contact [facility] at [phone] with questions or concerns regarding your invoice.   IF you received labwork today, you will receive an invoice from [facility]. Please contact [facility] at [phone] with questions or concerns regarding your invoice.   Our billing staff will not be able to assist you with questions regarding bills from these companies.  You will be contacted with the lab results as soon as they are available. The fastest way to get your results is to activate your My Chart account. Instructions are located on the last page of this paperwork. If you have not heard from us regarding the results in 2 weeks, please contact this office.     

## 2016-09-24 NOTE — Progress Notes (Signed)
Edra Riccardi  MRN: [id number] DOB: 29-Jul-1970  PCP: Mancel Bale, PA-C  Chief Complaint  Patient presents with  . Vaginal Bleeding    with some left breast pain x 3 weeks since starting birth control.     Subjective:  Pt presents to clinic for birth control problems.  She started the Apri about 3 months ago and since then she has had break through bleeding that starts the end of the 2nd week of pills and last for several days with intermittent flow.  She has also had increased breast tenderness esp on the left side - she does have a tender area that feels like past fibrous cyst that she has had.  She has not had a mammogram.  She also has a lump in her suprapubic area that she can sometimes feel - more in the am when she 1st wakes up - her sexual partner can also feel it.    Review of Systems  Genitourinary: Positive for menstrual problem.    Patient Active Problem List   Diagnosis Date Noted  . SVT (supraventricular tachycardia) ([place]) 08/08/2014  . ADD (attention deficit disorder) 08/08/2014  . Generalized anxiety disorder 08/08/2014    Current Outpatient Prescriptions on File Prior to Visit  Medication Sig Dispense Refill  . ALPRAZolam (XANAX) 0.5 MG tablet Take 0.5 mg by mouth 3 (three) times daily as needed for anxiety.    Marland Kitchen amphetamine-dextroamphetamine (ADDERALL) 20 MG tablet Take 20 mg by mouth 3 (three) times daily.    Marland Kitchen atenolol (TENORMIN) 25 MG tablet Take 1 tablet (25 mg total) by mouth daily. 90 tablet 3  . ENSKYCE 0.15-30 MG-MCG tablet TAKE ONE TABLET BY MOUTH ONCE DAILY AS  DIRECTED 84 tablet 0  . fluconazole (DIFLUCAN) 150 MG tablet Take 1 tablet (150 mg total) by mouth once. May repeat in 1 week if needed. 2 tablet 0  . hydrocortisone (ANUSOL-HC) 2.5 % rectal cream Place 1 application rectally 2 (two) times daily. 30 g 1  . levonorgestrel-ethinyl estradiol (AVIANE) 0.1-20 MG-MCG tablet Take 1 tablet by mouth daily. 3 Package 4  . metroNIDAZOLE (FLAGYL) 500 MG  tablet Take 1 tablet (500 mg total) by mouth 2 (two) times daily. 14 tablet 1  . triamcinolone cream (KENALOG) 0.5 % Apply 1 application topically 2 (two) times daily. 45 g 1  . valACYclovir (VALTREX) 1000 MG tablet Take 1/2 tablet daily for suppression, then 1/2 tablet twice daily for 3 days as needed for outbreaks 30 tablet 12   No current facility-administered medications on file prior to visit.     Allergies  Allergen Reactions  . Sulfa Antibiotics Nausea Only  . Codeine Other (See Comments)  . Hydrocodone Other (See Comments)    Pt patients past, family and social history were reviewed and updated.   Objective:  BP 140/89   Pulse 93   Temp 98 F (36.7 C) (Oral)   Resp 18   Ht 5' 5.55" (1.665 m)   Wt 132 lb 9.6 oz (60.1 kg)   SpO2 98%   BMI 21.70 kg/m   Physical Exam  Constitutional: She is oriented to person, place, and time and well-developed, well-nourished, and in no distress.  HENT:  Head: Normocephalic and atraumatic.  Right Ear: Hearing and external ear normal.  Left Ear: Hearing and external ear normal.  Eyes: Conjunctivae are normal.  Neck: Normal range of motion.  Cardiovascular: Normal rate, regular rhythm and normal heart sounds.   No murmur heard. Pulmonary/Chest: Effort normal  and breath sounds normal. She has no wheezes. Right breast exhibits no inverted nipple, no mass, no nipple discharge, no skin change and no tenderness. Left breast exhibits mass. Left breast exhibits no inverted nipple, no skin change and no tenderness.    Abdominal: Normal appearance. She exhibits mass (palapable 4x6 nontender area suprapubic area). There is no tenderness.  Lymphadenopathy:    She has no axillary adenopathy.  Neurological: She is alert and oriented to person, place, and time. Gait normal.  Skin: Skin is warm and dry.  Psychiatric: Mood, memory, affect and judgment normal.  Vitals reviewed.   Assessment and Plan :  Irregular menses - Plan:  norethindrone-ethinyl estradiol (NECON 0.5/35, 28,) 0.5-35 MG-MCG tablet -- increase her estrogen component - she has always had heavy menses but as she is getting older it seems to be getting heavier - she is also having more profound mood fluctuation prior to her menses.  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  The lass is likely a fibroid - pt would like to schedule her pap in 3 months at the f/u - she will be mindful of this area and if it gets larger she will let me know - she may need an [place] for diagnosis.  Her mom has uterine fibroids.  [name] PA-C  [facility] 09/24/2016 3:50 PM

## 2016-10-03 ENCOUNTER — Telehealth: Payer: Self-pay | Admitting: Physician Assistant

## 2016-10-03 NOTE — Telephone Encounter (Signed)
Continue for how long to see if true issue?

## 2016-10-03 NOTE — Telephone Encounter (Signed)
PATIENT STATES SHE SAW SARAH ABOUT A WEEK AGO REGARDING HER BIRTH CONTROL. SARAH STARTED HER ON A NEW PILL (NORETHINDRONE-ETHINYL ESTRADIOL 0.5-35) AND TOLD HER TO CALL BACK IF SHE HAD ANY PROBLEMS. PATIENT SAID HER BLEEDING IS A LOT HEAVIER AND HER HOT FLASHES ARE WORSE. IS THIS A SIDE EFFECT? SHOULD SHE CONTINUE TO TAKE IT TO SEE IF HER SYMPTOMS CONTINUE? BEST PHONE [phone] (CELL) PHARMACY CHOICE IS [facility] ON [address] [place]

## 2016-10-04 MED ORDER — ETHYNODIOL DIAC-ETH ESTRADIOL 1-35 MG-MCG PO TABS: 1.0000 | ORAL_TABLET | Freq: Every day | ORAL | 11 refills | Status: DC

## 2016-10-04 NOTE — Telephone Encounter (Signed)
I have sent another medication to the pharmacy - lets see if that will help.

## 2016-10-04 NOTE — Telephone Encounter (Signed)
PT STATES THAT THE BLEEDING HAS STOPPED SINCE SHE STOPPED THE PILLS   SHE FEELS THAT IT NEEDS TO BE CHANGED

## 2016-10-08 ENCOUNTER — Telehealth: Payer: Self-pay | Admitting: Physician Assistant

## 2016-10-08 NOTE — Telephone Encounter (Signed)
See prior note, l/m to try new pill and f/u

## 2016-10-08 NOTE — Telephone Encounter (Signed)
L/m with saras note

## 2016-10-08 NOTE — Telephone Encounter (Signed)
What is question?

## 2016-10-08 NOTE — Telephone Encounter (Signed)
Pt is returning a call from Judson Roch regarding her birth control and how she is doing she states she wants to ask her a question and if she needs a new visit   Best number [phone]

## 2016-10-09 NOTE — Telephone Encounter (Signed)
You changed pill, when should she see you next?

## 2016-10-11 NOTE — Telephone Encounter (Signed)
Na and no vm 

## 2016-10-11 NOTE — Telephone Encounter (Signed)
In a year if no problems sooner if problems

## 2016-10-22 NOTE — Telephone Encounter (Signed)
Please call the patient and get information what which pills have been causing which side effects so I can help determine next step.

## 2016-12-31 ENCOUNTER — Encounter: Payer: Self-pay | Admitting: Physician Assistant

## 2017-02-04 ENCOUNTER — Encounter: Payer: Self-pay | Admitting: Physician Assistant

## 2017-05-19 ENCOUNTER — Ambulatory Visit: Payer: Self-pay
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
## 2017-05-22 ENCOUNTER — Ambulatory Visit (INDEPENDENT_AMBULATORY_CARE_PROVIDER_SITE_OTHER): Payer: BLUE CROSS/BLUE SHIELD

## 2017-05-22 ENCOUNTER — Other Ambulatory Visit: Payer: Self-pay

## 2017-05-22 ENCOUNTER — Encounter: Payer: Self-pay | Admitting: Family Medicine

## 2017-05-22 ENCOUNTER — Ambulatory Visit: Payer: BLUE CROSS/BLUE SHIELD | Admitting: Family Medicine

## 2017-05-22 VITALS — BP 126/70 | HR 87 | Temp 98.7°F | Resp 16 | Ht 65.55 in | Wt 135.6 lb

## 2017-05-22 DIAGNOSIS — M2550 Pain in unspecified joint: Secondary | ICD-10-CM

## 2017-05-22 DIAGNOSIS — M7711 Lateral epicondylitis, right elbow: Secondary | ICD-10-CM

## 2017-05-22 DIAGNOSIS — M25521 Pain in right elbow: Secondary | ICD-10-CM

## 2017-05-22 LAB — POCT CBC
GRANULOCYTE PERCENT: 65.1 % (ref 37–80)
HEMATOCRIT: 31.4 % — AB (ref 37.7–47.9)
HEMOGLOBIN: 10.1 g/dL — AB (ref 12.2–16.2)
Lymph, poc: 1.3 (ref 0.6–3.4)
MCH, POC: 23.8 pg — AB (ref 27–31.2)
MCHC: 32.3 g/dL (ref 31.8–35.4)
MCV: 73.5 fL — AB (ref 80–97)
MID (cbc): 0.6 (ref 0–0.9)
MPV: 7.1 fL (ref 0–99.8)
PLATELET COUNT, POC: 326 10*3/uL (ref 142–424)
POC GRANULOCYTE: 3.6 (ref 2–6.9)
POC LYMPH PERCENT: 23.4 %L (ref 10–50)
POC MID %: 11.5 %M (ref 0–12)
RBC: 4.27 M/uL (ref 4.04–5.48)
RDW, POC: 21.1 %
WBC: 5.5 10*3/uL (ref 4.6–10.2)

## 2017-05-22 MED ORDER — IBUPROFEN 800 MG PO TABS: 800.0000 mg | ORAL_TABLET | Freq: Three times a day (TID) | ORAL | 0 refills | Status: DC | PRN

## 2017-05-22 NOTE — Patient Instructions (Addendum)
     IF you received an x-ray today, you will receive an invoice from [facility]. Please contact  Radiology at [phone] with questions or concerns regarding your invoice.   IF you received labwork today, you will receive an invoice from [facility]. Please contact [facility] at [phone] with questions or concerns regarding your invoice.   Our billing staff will not be able to assist you with questions regarding bills from these companies.  You will be contacted with the lab results as soon as they are available. The fastest way to get your results is to activate your My Chart account. Instructions are located on the last page of this paperwork. If you have not heard from us regarding the results in 2 weeks, please contact this office.    Joint Pain Joint pain, which is also called arthralgia, can be caused by many things. Joint pain often goes away when you follow your health care provider's instructions for relieving pain at home. However, joint pain can also be caused by conditions that require further treatment. Common causes of joint pain include:  Bruising in the area of the joint.  Overuse of the joint.  Wear and tear on the joints that occur with aging (osteoarthritis).  Various other forms of arthritis.  A buildup of a crystal form of uric acid in the joint (gout).  Infections of the joint (septic arthritis) or of the bone (osteomyelitis).  Your health care provider may recommend medicine to help with the pain. If your joint pain continues, additional tests may be needed to diagnose your condition. Follow these instructions at home: Watch your condition for any changes. Follow these instructions as directed to lessen the pain that you are feeling.  Take medicines only as directed by your health care provider.  Rest the affected area for as long as your health care provider says that you should. If directed to do so, raise the painful joint above the  level of your heart while you are sitting or lying down.  Do not do things that cause or worsen pain.  If directed, apply ice to the painful area: ? Put ice in a plastic bag. ? Place a towel between your skin and the bag. ? Leave the ice on for 20 minutes, 2-3 times per day.  Wear an elastic bandage, splint, or sling as directed by your health care provider. Loosen the elastic bandage or splint if your fingers or toes become numb and tingle, or if they turn cold and blue.  Begin exercising or stretching the affected area as directed by your health care provider. Ask your health care provider what types of exercise are safe for you.  Keep all follow-up visits as directed by your health care provider. This is important.  Contact a health care provider if:  Your pain increases, and medicine does not help.  Your joint pain does not improve within 3 days.  You have increased bruising or swelling.  You have a fever.  You lose 10 lb (4.5 kg) or more without trying. Get help right away if:  You are not able to move the joint.  Your fingers or toes become numb or they turn cold and blue. This information is not intended to replace advice given to you by your health care provider. Make sure you discuss any questions you have with your health care provider. Document Released: 04/29/2005 Document Revised: 09/29/2015 Document Reviewed: 02/08/2014 Elsevier Interactive Patient Education  2018 Elsevier Inc.  
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
## 2017-05-23 LAB — RHEUMATOID FACTOR: RHEUMATOID FACTOR: 10.9 [IU]/mL (ref 0.0–13.9)

## 2017-05-23 LAB — ANA W/REFLEX IF POSITIVE: ANA: NEGATIVE

## 2017-06-04 ENCOUNTER — Ambulatory Visit: Payer: Self-pay | Admitting: Physician Assistant

## 2017-06-10 ENCOUNTER — Ambulatory Visit (INDEPENDENT_AMBULATORY_CARE_PROVIDER_SITE_OTHER): Payer: BLUE CROSS/BLUE SHIELD | Admitting: Physician Assistant

## 2017-06-10 ENCOUNTER — Encounter: Payer: Self-pay | Admitting: Physician Assistant

## 2017-06-10 ENCOUNTER — Other Ambulatory Visit: Payer: Self-pay

## 2017-06-10 VITALS — BP 132/86 | HR 115 | Temp 98.7°F | Resp 18 | Ht 65.55 in | Wt 136.2 lb

## 2017-06-10 DIAGNOSIS — N912 Amenorrhea, unspecified: Secondary | ICD-10-CM

## 2017-06-10 DIAGNOSIS — D649 Anemia, unspecified: Secondary | ICD-10-CM | POA: Diagnosis not present

## 2017-06-10 DIAGNOSIS — Z0000 Encounter for general adult medical examination without abnormal findings: Secondary | ICD-10-CM

## 2017-06-10 DIAGNOSIS — I471 Supraventricular tachycardia: Secondary | ICD-10-CM

## 2017-06-10 DIAGNOSIS — Z130 Encounter for screening for diseases of the blood and blood-forming organs and certain disorders involving the immune mechanism: Secondary | ICD-10-CM

## 2017-06-10 DIAGNOSIS — M25542 Pain in joints of left hand: Secondary | ICD-10-CM

## 2017-06-10 DIAGNOSIS — Z1231 Encounter for screening mammogram for malignant neoplasm of breast: Secondary | ICD-10-CM

## 2017-06-10 DIAGNOSIS — Z202 Contact with and (suspected) exposure to infections with a predominantly sexual mode of transmission: Secondary | ICD-10-CM

## 2017-06-10 DIAGNOSIS — M25541 Pain in joints of right hand: Secondary | ICD-10-CM | POA: Diagnosis not present

## 2017-06-10 DIAGNOSIS — Z13220 Encounter for screening for lipoid disorders: Secondary | ICD-10-CM

## 2017-06-10 DIAGNOSIS — Z1321 Encounter for screening for nutritional disorder: Secondary | ICD-10-CM | POA: Diagnosis not present

## 2017-06-10 DIAGNOSIS — A6000 Herpesviral infection of urogenital system, unspecified: Secondary | ICD-10-CM

## 2017-06-10 DIAGNOSIS — R202 Paresthesia of skin: Secondary | ICD-10-CM

## 2017-06-10 DIAGNOSIS — Z13228 Encounter for screening for other metabolic disorders: Secondary | ICD-10-CM

## 2017-06-10 DIAGNOSIS — Z8719 Personal history of other diseases of the digestive system: Secondary | ICD-10-CM

## 2017-06-10 DIAGNOSIS — N898 Other specified noninflammatory disorders of vagina: Secondary | ICD-10-CM

## 2017-06-10 DIAGNOSIS — Z01419 Encounter for gynecological examination (general) (routine) without abnormal findings: Secondary | ICD-10-CM

## 2017-06-10 LAB — POCT URINE PREGNANCY: Preg Test, Ur: NEGATIVE

## 2017-06-10 MED ORDER — VALACYCLOVIR HCL 1 G PO TABS: ORAL_TABLET | ORAL | 12 refills | Status: AC

## 2017-06-10 MED ORDER — HYDROCORTISONE 2.5 % RE CREA: 1.0000 "application " | TOPICAL_CREAM | Freq: Two times a day (BID) | RECTAL | 1 refills | Status: DC

## 2017-06-10 MED ORDER — CYCLOBENZAPRINE HCL 5 MG PO TABS: 5.0000 mg | ORAL_TABLET | Freq: Three times a day (TID) | ORAL | 1 refills | Status: DC | PRN

## 2017-06-10 MED ORDER — METRONIDAZOLE 500 MG PO TABS: 500.0000 mg | ORAL_TABLET | Freq: Two times a day (BID) | ORAL | 1 refills | Status: DC

## 2017-06-10 NOTE — Progress Notes (Signed)
 Lindsey Adams  MRN: [id number] DOB: 07/18/1970  PCP: ,  L, PA-C  Subjective:  Pt presents to clinic for a CPE. She needs a pap smear it has been more than 5 years since she had one.    Tingling - burning pain in hands - right is worse than the left - pain in the morning - every morning she complains to someone - going on for weeks but does not think that it is getting worse - started when she started the tegretol - She is working as a bartender.  She has had carpal tunnel in the past and this feels difference.  No joint swelling.  She has started wearing compression gloves and they have helped.  She is having no neck pain and it does not increase her pain/problems when she moves her neck.  Stopped OCP since August.  No menses since then. About the time she started tegretol from her Psychiatrist for her mood - it seems to be helping her mood a lot.  She has since stopped the OCP and her menses has not returned.  She is sexually active with her husband and would like to have STD testing.  She has not taken a pregnancy test but she is having no symptoms of pregnancy.  She is having hot flashes at night.  Her mother had a hysterectomy at a young age.  Last pap: several years ago Last mammo: never had one  Vaccinations      Tetanus - refused    Patient Active Problem List   Diagnosis Date Noted  . 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 SVT (supraventricular tachycardia) (HCC) - stopped medication because it dropped her BP and she felt terrible - she plans to see cardiology again 08/08/2014  . ADD (attention deficit disorder) 08/08/2014  . Generalized anxiety disorder 08/08/2014    Review of Systems  Constitutional: Negative.   HENT: Negative.   Eyes: Negative.   Respiratory: Negative.   Cardiovascular: Negative.   Gastrointestinal: Negative.   Endocrine: Negative.   Genitourinary: Positive for menstrual problem (no menses for 6 months) and vaginal discharge  (thick white - no itching - started after no menses and stop of OCP).  Musculoskeletal: Negative.   Skin: Negative.   Allergic/Immunologic: Negative.   Neurological: Positive for numbness (in her bilateral fingers - all of them - and burning pain - no neck or shoulder pain). Negative for weakness.  Hematological: Negative.   Psychiatric/Behavioral: Negative.      Current Outpatient Medications on File Prior to Visit  Medication Sig Dispense Refill  . ALPRAZolam (XANAX) 0.5 MG tablet Take 0.5 mg by mouth 3 (three) times daily as needed for anxiety.    . amphetamine-dextroamphetamine (ADDERALL) 20 MG tablet Take 20 mg by mouth 3 (three) times daily.    . atenolol (TENORMIN) 25 MG tablet Take 1 tablet (25 mg total) by mouth daily. 90 tablet 3  . ibuprofen (ADVIL,MOTRIN) 800 MG tablet Take 1 tablet (800 mg total) by mouth every 8 (eight) hours as needed. 30 tablet 0  . triamcinolone cream (KENALOG) 0.5 % Apply 1 application topically 2 (two) times daily. 45 g 1   No current facility-administered medications on file prior to visit.     Allergies  Allergen Reactions  . Sulfa Antibiotics Nausea Only  . Codeine Other (See Comments)  . Hydrocodone Other (See Comments)    Social History   Socioeconomic History  . Marital status: Divorced    Spouse   name: None  . Number of children: 1  . Years of education: None  . Highest education level: None  Social Needs  . Financial resource strain: None  . Food insecurity - worry: None  . Food insecurity - inability: None  . Transportation needs - medical: None  . Transportation needs - non-medical: None  Occupational History  . None  Tobacco Use  . Smoking status: Never Smoker  . Smokeless tobacco: Never Used  Substance and Sexual Activity  . Alcohol use: Yes    Alcohol/week: 0.0 oz    Comment: 1-2 night  . Drug use: No  . Sexual activity: Yes    Partners: Male  Other Topics Concern  . 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 APPENDECTOMY    . BREAST REDUCTION SURGERY Bilateral   . CESAREAN SECTION    . COSMETIC SURGERY      History reviewed. No pertinent family history.   Objective:  BP 132/86   Pulse (!) 115   Temp 98.7 F (37.1 C) (Oral)   Resp 18   Ht 5' 5.55" (1.665 m)   Wt 136 lb 3.2 oz (61.8 kg)   SpO2 98%   BMI 22.29 kg/m   Physical Exam  Constitutional: She is oriented to person, place, and time and well-developed, well-nourished, and in no distress.  HENT:  Head: Normocephalic and atraumatic.  Right Ear: Hearing, tympanic membrane, external ear and ear canal normal.  Left Ear: Hearing, tympanic membrane, external ear and ear canal normal.  Nose: Nose normal.  Mouth/Throat: Uvula is midline, oropharynx is clear and moist and mucous membranes are normal.  Eyes: Conjunctivae and EOM are normal. Pupils are equal, round, and reactive to light.  Neck: Trachea normal and normal range of motion. Neck supple. No thyroid mass and no thyromegaly present.  Cardiovascular: Normal rate, regular rhythm and normal heart sounds.  No murmur heard. Pulmonary/Chest: Effort normal and breath sounds normal. She has no wheezes.  Abdominal: Soft. Bowel sounds are normal. There is no tenderness.  Genitourinary: Uterus normal, cervix normal, right adnexa normal and left adnexa normal. Thick  odorless  white and vaginal discharge found.  Musculoskeletal: Normal range of motion.  Lymphadenopathy:    She has no cervical adenopathy.  Neurological: She is alert and oriented to person, place, and time. She has normal motor skills, normal sensation, normal strength and normal reflexes. Gait normal.  Skin: Skin is warm and dry.  Psychiatric: Mood, memory, affect and judgment normal.    Wt Readings from Last 3 Encounters:  06/10/17 136 lb 3.2 oz (61.8 kg)  05/22/17 135 lb 9.6 oz (61.5 kg)  09/24/16 132 lb 9.6 oz (60.1 kg)     Results for orders placed or performed in visit on 06/10/17  POCT urine pregnancy  Result Value Ref Range   Preg Test, Ur Negative Negative    Assessment and Plan :  Annual physical exam  Encounter for gynecological examination without abnormal finding - Plan: Pap IG, CT/NG NAA, and HPV (high risk)  Amenorrhea/nightsweats - Plan: FSH/LH, POCT urine pregnancy, TSH - check labs to see if menopausal - d/w pt nighttime habits to reduce night sweats - if no help consider non-hormonal control of symptoms  Screening cholesterol level - Plan: Lipid panel  Screening for metabolic disorder - Plan: CMP14+EGFR  Screening for deficiency  anemia - Plan: CBC with Differential/Platelet  Encounter for vitamin deficiency screening - Plan: VITAMIN D 25 Hydroxy (Vit-D Deficiency, Fractures)  Encounter for screening mammogram for breast cancer - Plan: MM Digital Screening - pt has never she was encouraged strongly to have this exam completed  Paresthesia of hand, bilateral - Plan: Ambulatory referral to Orthopedic Surgery, cyclobenzaprine (FLEXERIL) 5 MG tablet - unsure of cause - we will try a muscle relaxer to see if that makes any different - she is having no neck pain.  I think and EMG would be helpful - we will check vit levels to see if that could be the cause  Exposure to STD - Plan: HIV antibody, RPR  Arthralgia of both hands - Plan: Ambulatory referral to Orthopedic Surgery, Sedimentation rate, cyclobenzaprine (FLEXERIL) 5 MG tablet  History of hemorrhoids - Plan: hydrocortisone (ANUSOL-HC) 2.5 % rectal cream - to help if she develops hemorrhoids  Genital herpes simplex, unspecified site - Plan: valACYclovir (VALTREX) 1000 MG tablet  SVT (supraventricular tachycardia) ([place])  Vaginal discharge - Plan: [place], YEAST, CLUE  [name] PA-C  [facility] [place] 06/10/2017 3:32 PM

## 2017-06-10 NOTE — Patient Instructions (Addendum)
Please download the APP called mychart - then use the text to activate this APP - this will allow you to look at your labs and contact me as well as make appointments to see me in the future.     IF you received an x-ray today, you will receive an invoice from [facility]. Please contact [facility] at [phone] with questions or concerns regarding your invoice.   IF you received labwork today, you will receive an invoice from [facility]. Please contact [facility] at [phone] with questions or concerns regarding your invoice.   Our billing staff will not be able to assist you with questions regarding bills from these companies.  You will be contacted with the lab results as soon as they are available. The fastest way to get your results is to activate your My Chart account. Instructions are located on the last page of this paperwork. If you have not heard from us regarding the results in 2 weeks, please contact this office.     

## 2017-06-11 ENCOUNTER — Encounter: Payer: Self-pay | Admitting: Family Medicine

## 2017-06-11 ENCOUNTER — Ambulatory Visit: Payer: BLUE CROSS/BLUE SHIELD | Admitting: Physical Therapy

## 2017-06-11 LAB — CBC WITH DIFFERENTIAL/PLATELET
BASOS ABS: 0.1 10*3/uL (ref 0.0–0.2)
Basos: 2 %
EOS (ABSOLUTE): 0.5 10*3/uL — ABNORMAL HIGH (ref 0.0–0.4)
EOS: 9 %
HEMATOCRIT: 30.9 % — AB (ref 34.0–46.6)
HEMOGLOBIN: 9.5 g/dL — AB (ref 11.1–15.9)
IMMATURE GRANULOCYTES: 0 %
Immature Grans (Abs): 0 10*3/uL (ref 0.0–0.1)
LYMPHS ABS: 1.8 10*3/uL (ref 0.7–3.1)
Lymphs: 33 %
MCH: 23.3 pg — ABNORMAL LOW (ref 26.6–33.0)
MCHC: 30.7 g/dL — AB (ref 31.5–35.7)
MCV: 76 fL — ABNORMAL LOW (ref 79–97)
MONOCYTES: 8 %
MONOS ABS: 0.4 10*3/uL (ref 0.1–0.9)
NEUTROS PCT: 48 %
Neutrophils Absolute: 2.7 10*3/uL (ref 1.4–7.0)
Platelets: 305 10*3/uL (ref 150–379)
RBC: 4.08 x10E6/uL (ref 3.77–5.28)
RDW: 20.6 % — AB (ref 12.3–15.4)
WBC: 5.5 10*3/uL (ref 3.4–10.8)

## 2017-06-11 LAB — VITAMIN D 25 HYDROXY (VIT D DEFICIENCY, FRACTURES): VIT D 25 HYDROXY: 25.8 ng/mL — AB (ref 30.0–100.0)

## 2017-06-11 LAB — CMP14+EGFR
ALK PHOS: 56 IU/L (ref 39–117)
ALT: 12 IU/L (ref 0–32)
AST: 16 IU/L (ref 0–40)
Albumin/Globulin Ratio: 1.7 (ref 1.2–2.2)
Albumin: 4.7 g/dL (ref 3.5–5.5)
BUN / CREAT RATIO: 12 (ref 9–23)
BUN: 8 mg/dL (ref 6–24)
CHLORIDE: 100 mmol/L (ref 96–106)
CO2: 22 mmol/L (ref 20–29)
CREATININE: 0.66 mg/dL (ref 0.57–1.00)
Calcium: 9.3 mg/dL (ref 8.7–10.2)
GFR calc Af Amer: 123 mL/min/{1.73_m2} (ref >59)
GFR calc non Af Amer: 106 mL/min/{1.73_m2} (ref >59)
GLUCOSE: 70 mg/dL (ref 65–99)
Globulin, Total: 2.8 g/dL (ref 1.5–4.5)
Potassium: 4.4 mmol/L (ref 3.5–5.2)
Sodium: 140 mmol/L (ref 134–144)
Total Protein: 7.5 g/dL (ref 6.0–8.5)

## 2017-06-11 LAB — LIPID PANEL
Chol/HDL Ratio: 2 ratio (ref 0.0–4.4)
Cholesterol, Total: 251 mg/dL — ABNORMAL HIGH (ref 100–199)
HDL: 123 mg/dL (ref >39)
LDL CALC: 101 mg/dL — AB (ref 0–99)
Triglycerides: 134 mg/dL (ref 0–149)
VLDL CHOLESTEROL CAL: 27 mg/dL (ref 5–40)

## 2017-06-11 LAB — SEDIMENTATION RATE: Sed Rate: 19 mm/hr (ref 0–32)

## 2017-06-11 LAB — TSH: TSH: 0.874 u[IU]/mL (ref 0.450–4.500)

## 2017-06-11 LAB — RPR: RPR: NONREACTIVE

## 2017-06-11 LAB — FSH/LH
FSH: 36.6 m[IU]/mL
LH: 51.1 m[IU]/mL

## 2017-06-11 LAB — HIV ANTIBODY (ROUTINE TESTING W REFLEX): HIV Screen 4th Generation wRfx: NONREACTIVE

## 2017-06-12 ENCOUNTER — Telehealth: Payer: Self-pay

## 2017-06-12 LAB — PAP IG, CT-NG NAA, HPV HIGH-RISK
CHLAMYDIA, NUC. ACID AMP: NEGATIVE
GONOCOCCUS BY NUCLEIC ACID AMP: NEGATIVE
HPV, HIGH-RISK: NEGATIVE
PAP Smear Comment: 0

## 2017-06-12 LAB — WET PREP FOR TRICH, YEAST, CLUE
Clue Cell Exam: NEGATIVE
Trichomonas Exam: NEGATIVE
Yeast Exam: NEGATIVE

## 2017-06-12 NOTE — Telephone Encounter (Signed)
Lab result sent via mail to pt home address on file. Dgaddy, CMA

## 2017-06-17 ENCOUNTER — Telehealth: Payer: Self-pay | Admitting: *Deleted

## 2017-06-17 NOTE — Telephone Encounter (Signed)
Voicemail full unable to leave message

## 2017-06-19 LAB — IRON AND TIBC
IRON SATURATION: 3 % — AB (ref 15–55)
Iron: 14 ug/dL — ABNORMAL LOW (ref 27–159)
TIBC: 476 ug/dL — AB (ref 250–450)
UIBC: 462 ug/dL — AB (ref 131–425)

## 2017-06-19 LAB — SPECIMEN STATUS REPORT

## 2017-06-30 DIAGNOSIS — G5602 Carpal tunnel syndrome, left upper limb: Secondary | ICD-10-CM | POA: Insufficient documentation

## 2017-06-30 DIAGNOSIS — G5601 Carpal tunnel syndrome, right upper limb: Secondary | ICD-10-CM | POA: Insufficient documentation

## 2017-07-01 ENCOUNTER — Encounter: Payer: Self-pay | Admitting: Physician Assistant

## 2017-07-07 ENCOUNTER — Other Ambulatory Visit: Payer: Self-pay | Admitting: Physician Assistant

## 2017-07-07 DIAGNOSIS — G5601 Carpal tunnel syndrome, right upper limb: Secondary | ICD-10-CM | POA: Diagnosis not present

## 2017-07-07 DIAGNOSIS — G5602 Carpal tunnel syndrome, left upper limb: Secondary | ICD-10-CM | POA: Diagnosis not present

## 2017-07-07 DIAGNOSIS — Z1231 Encounter for screening mammogram for malignant neoplasm of breast: Secondary | ICD-10-CM

## 2017-07-18 DIAGNOSIS — G5602 Carpal tunnel syndrome, left upper limb: Secondary | ICD-10-CM | POA: Diagnosis not present

## 2017-07-18 DIAGNOSIS — G5601 Carpal tunnel syndrome, right upper limb: Secondary | ICD-10-CM | POA: Diagnosis not present

## 2017-07-28 ENCOUNTER — Ambulatory Visit: Payer: BLUE CROSS/BLUE SHIELD

## 2017-08-21 ENCOUNTER — Encounter: Payer: Self-pay | Admitting: Physician Assistant

## 2017-08-21 DIAGNOSIS — F900 Attention-deficit hyperactivity disorder, predominantly inattentive type: Secondary | ICD-10-CM | POA: Diagnosis not present

## 2017-08-21 DIAGNOSIS — F3181 Bipolar II disorder: Secondary | ICD-10-CM | POA: Diagnosis not present

## 2017-10-13 ENCOUNTER — Encounter: Payer: Self-pay | Admitting: Physician Assistant

## 2017-10-13 ENCOUNTER — Ambulatory Visit: Payer: Self-pay | Admitting: *Deleted

## 2017-10-13 NOTE — Telephone Encounter (Signed)
Patient has draining blisters to lesions on r shin and torso appt  Made for tomorrow with [name]    Reason for Disposition . [1] Cause unknown AND [2] no new blisters  Answer Assessment - Initial Assessment Questions 1. APPEARANCE of BLISTER: "What does it look like?"       Tiny lesions and one  Large  Leaking blister r  Shin  Tl  Torso   2. SIZE: "How large is the blister?" (inches, cm or compare to coins)         1  Blister weeping   And the other has  Not burst yet   3. LOCATION: "Where are the blisters located?"   r shin and  l  Torso   4. WHEN: "When did the blister happen?"      Torso has gotten better legis  Worse   5. CAUSE: "What do you think caused the blister?"      PATIENT THINJS IT MAY BE  RINGWIRM   6. PAIN: "Does it hurt?" If so, ask: "How bad is the pain?"  (Scale 1-10; or mild, moderate, severe)      No   -  Itching   7. OTHER SYMPTOMS: "Do you have any other symptoms?" (e.g., fever)      Fever  Protocols used: BLISTER - FOOT AND HAND-A-AH

## 2017-10-14 ENCOUNTER — Encounter: Payer: Self-pay | Admitting: Physician Assistant

## 2017-10-14 ENCOUNTER — Ambulatory Visit: Payer: BLUE CROSS/BLUE SHIELD | Admitting: Physician Assistant

## 2017-10-14 ENCOUNTER — Other Ambulatory Visit: Payer: Self-pay

## 2017-10-14 VITALS — BP 102/70 | HR 110 | Temp 98.0°F | Resp 18 | Ht 65.55 in | Wt 135.6 lb

## 2017-10-14 DIAGNOSIS — L308 Other specified dermatitis: Secondary | ICD-10-CM | POA: Diagnosis not present

## 2017-10-14 DIAGNOSIS — IMO0001 Reserved for inherently not codable concepts without codable children: Secondary | ICD-10-CM

## 2017-10-14 DIAGNOSIS — R21 Rash and other nonspecific skin eruption: Secondary | ICD-10-CM

## 2017-10-14 DIAGNOSIS — Z789 Other specified health status: Secondary | ICD-10-CM

## 2017-10-14 MED ORDER — MUPIROCIN 2 % EX OINT: 1.0000 "application " | TOPICAL_OINTMENT | Freq: Two times a day (BID) | CUTANEOUS | 0 refills | Status: DC

## 2017-10-14 MED ORDER — TRIAMCINOLONE ACETONIDE 0.5 % EX CREA: 1.0000 "application " | TOPICAL_CREAM | Freq: Two times a day (BID) | CUTANEOUS | 0 refills | Status: DC

## 2017-10-14 MED ORDER — ETHYNODIOL DIAC-ETH ESTRADIOL 1-35 MG-MCG PO TABS: 1.0000 | ORAL_TABLET | Freq: Every day | ORAL | 4 refills | Status: DC

## 2017-10-14 NOTE — Patient Instructions (Addendum)
bactroban (mupricon) for leaking rash  Triamcinolone for itchy dry rash    IF you received an x-ray today, you will receive an invoice from [facility]. Please contact [facility] at [phone] with questions or concerns regarding your invoice.   IF you received labwork today, you will receive an invoice from [facility]. Please contact [facility] at [phone] with questions or concerns regarding your invoice.   Our billing staff will not be able to assist you with questions regarding bills from these companies.  You will be contacted with the lab results as soon as they are available. The fastest way to get your results is to activate your My Chart account. Instructions are located on the last page of this paperwork. If you have not heard from [place] regarding the results in 2 weeks, please contact this office.

## 2017-10-14 NOTE — Progress Notes (Signed)
Lindsey Adams  MRN: [id number] DOB: 26-Dec-1970  PCP: Mancel Bale, PA-C  Chief Complaint  Patient presents with  . Blister    on right leg and left side pt states its been there for months   . Medication Refill    BC meds    Subjective:  Pt presents to clinic for rash on her left side and rash on her right lower shin.  She has had both of these rashes since April.  The one on her side is dry and she has been using clotrimazole and it is better but she still has remaining slightly red bumps with some itching.  The rash on her shin also itches and then over the last month has starting oozing clear yellow fluid - she has continued to use clotrimazole.  She has noticed no other spots on her body.  No one at home has similar rash.  Dog is not scratching.  History is obtained by patient.  Review of Systems  Constitutional: Negative for chills and fever.  Skin: Positive for rash.    Patient Active Problem List   Diagnosis Date Noted  . 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 SVT (supraventricular tachycardia) ([place]) 08/08/2014  . ADD (attention deficit disorder) 08/08/2014  . Generalized anxiety disorder 08/08/2014    Current Outpatient Medications on File Prior to Visit  Medication Sig Dispense Refill  . ALPRAZolam (XANAX) 0.5 MG tablet Take 0.5 mg by mouth 3 (three) times daily as needed for anxiety.    Marland Kitchen amphetamine-dextroamphetamine (ADDERALL) 20 MG tablet Take 20 mg by mouth 3 (three) times daily.    Marland Kitchen atenolol (TENORMIN) 25 MG tablet Take 1 tablet (25 mg total) by mouth daily. 90 tablet 3  . cyclobenzaprine (FLEXERIL) 5 MG tablet Take 1-2 tablets (5-10 mg total) by mouth 3 (three) times daily as needed for muscle spasms. 60 tablet 1  . hydrocortisone (ANUSOL-HC) 2.5 % rectal cream Place 1 application rectally 2 (two) times daily. 30 g 1  . ibuprofen (ADVIL,MOTRIN) 800 MG tablet Take 1 tablet (800 mg total) by mouth every 8 (eight) hours as needed. 30 tablet 0  . metroNIDAZOLE (FLAGYL)  500 MG tablet Take 1 tablet (500 mg total) by mouth 2 (two) times daily. 14 tablet 1  . triamcinolone cream (KENALOG) 0.5 % Apply 1 application topically 2 (two) times daily. 45 g 1  . valACYclovir (VALTREX) 1000 MG tablet Take 1/2 tablet daily for suppression, then 1/2 tablet twice daily for 3 days as needed for outbreaks 30 tablet 12   No current facility-administered medications on file prior to visit.     Allergies  Allergen Reactions  . Sulfa Antibiotics Nausea Only  . Codeine Other (See Comments)  . Hydrocodone Other (See Comments)    Past Medical History:  Diagnosis Date  . Allergy   . Anxiety   . 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 Smoking status: Never Smoker  . Smokeless tobacco: Never Used  Substance Use Topics  . Alcohol use: Yes    Alcohol/week: 0.0 oz    Comment: 1-2 night  . Drug use: No   family history is not on file.     Objective:  BP 102/70   Pulse (!) 110   Temp 98 F (36.7 C) (Oral)   Resp 18  Ht 5' 5.55" (1.665 m)   Wt 135 lb 9.6 oz (61.5 kg)   SpO2 100%   BMI 22.19 kg/m  Body mass index is 22.19 kg/m.  Physical Exam  Constitutional: She is oriented to person, place, and time. She appears well-developed and well-nourished.  HENT:  Head: Normocephalic and atraumatic.  Right Ear: Hearing and external ear normal.  Left Ear: Hearing and external ear normal.  Eyes: Conjunctivae are normal.  Neck: Normal range of motion.  Pulmonary/Chest: Effort normal.  Neurological: She is alert and oriented to person, place, and time.  Skin: Skin is warm, dry and intact.  Left abd - mildly erythematous papules about 12mm in size Right lower shin -rash approximately 2 cm in size with vesicular border and central erythema with excoriation and oozing clear yellow drainage, no surrounding erythema -to the medial aspect of the spine there is an area where  the skin was removed per patient by Band-Aid  Psychiatric: She has a normal mood and affect. Her behavior is normal. Judgment and thought content normal.  Vitals reviewed.  Procedure: Verbal consent obtained, local anesthesia with 1% lidocaine with epi.  2 mm punch biopsy obtained.  Dressing with Bactroban applied to area Assessment and Plan :  Rash and nonspecific skin eruption - Plan: Dermatology pathology, WOUND CULTURE, mupirocin ointment (BACTROBAN) 2 %, triamcinolone cream (KENALOG) 0.5 % biopsy performed today as this rash has been present for greater than 2 months without obvious etiology today question of allergic reaction to topical skin creams especially with the itching and oozing patient will use Bactroban on the right leg wound, and triamcinolone on the left abdominal as this appears to be more dry skin  Birth control - Plan: ethynodiol-ethinyl estradiol (KELNOR,ZOVIA) 1-35 MG-MCG tablet -patient started having.  Again back on birth control would like to continue this for the next 2 months to see if she can get her periods controlled  Patient verbalized to me that she understood the following: their diagnosis, what is being done for it, what to expect and what should be done at home.  See after visit summary for patient specific instructions.   [name] PA-C  [facility] 10/14/2017 1:35 PM

## 2017-10-16 ENCOUNTER — Encounter: Payer: Self-pay | Admitting: Physician Assistant

## 2017-10-16 LAB — WOUND CULTURE: ORGANISM ID, BACTERIA: NONE SEEN

## 2017-10-16 MED ORDER — DOXYCYCLINE HYCLATE 100 MG PO TABS: 100.0000 mg | ORAL_TABLET | Freq: Two times a day (BID) | ORAL | 0 refills | Status: DC

## 2017-10-16 NOTE — Addendum Note (Signed)
Addended by: Mancel Bale on: 10/16/2017 06:28 PM   Modules accepted: Orders

## 2017-10-19 ENCOUNTER — Encounter: Payer: Self-pay | Admitting: Physician Assistant

## 2017-10-21 ENCOUNTER — Other Ambulatory Visit: Payer: Self-pay

## 2017-10-21 ENCOUNTER — Ambulatory Visit (INDEPENDENT_AMBULATORY_CARE_PROVIDER_SITE_OTHER): Payer: BLUE CROSS/BLUE SHIELD | Admitting: Physician Assistant

## 2017-10-21 ENCOUNTER — Encounter: Payer: Self-pay | Admitting: Physician Assistant

## 2017-10-21 VITALS — BP 110/70 | HR 104 | Temp 98.9°F | Resp 18 | Ht 65.55 in | Wt 135.6 lb

## 2017-10-21 DIAGNOSIS — R21 Rash and other nonspecific skin eruption: Secondary | ICD-10-CM | POA: Diagnosis not present

## 2017-10-21 NOTE — Telephone Encounter (Signed)
Can we change dermatology referral To Dr Allyson Sabal.  Thanks

## 2017-10-21 NOTE — Patient Instructions (Signed)
     IF you received an x-ray today, you will receive an invoice from [facility]. Please contact  Radiology at [phone] with questions or concerns regarding your invoice.   IF you received labwork today, you will receive an invoice from [facility]. Please contact [facility] at [phone] with questions or concerns regarding your invoice.   Our billing staff will not be able to assist you with questions regarding bills from these companies.  You will be contacted with the lab results as soon as they are available. The fastest way to get your results is to activate your My Chart account. Instructions are located on the last page of this paperwork. If you have not heard from us regarding the results in 2 weeks, please contact this office.     

## 2017-10-21 NOTE — Progress Notes (Signed)
Lindsey Adams  MRN: [id number] DOB: August 08, 1970  PCP: Mancel Bale, PA-C  Chief Complaint  Patient presents with  . Leg Pain    infection on leg follow up     Subjective:  Pt presents to clinic for concerns that her leg is not getting nay better.  She is taking doxycycline and is upsetting her stomach but she plans to continue the entire course.  She is using mupirocin on the open wound on her right shin.  She is having other papules which turned to vesicles appear in other parts of her body.  She has decreased her leg shaving and thought that that was it.  These areas itch before they become vesicles.  She is using triamcinolone cream on these lesions.  She is keeping the lesion on her right shin covered.  History is obtained by patient.  Review of Systems  Constitutional: Negative for chills and fever.  Skin: Positive for rash and wound.    Patient Active Problem List   Diagnosis Date Noted  . 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 SVT (supraventricular tachycardia) ([place]) 08/08/2014  . ADD (attention deficit disorder) 08/08/2014  . Generalized anxiety disorder 08/08/2014    Current Outpatient Medications on File Prior to Visit  Medication Sig Dispense Refill  . ALPRAZolam (XANAX) 1 MG tablet TAKE ONE TABLET BY MOUTH QID  5  . amphetamine-dextroamphetamine (ADDERALL) 20 MG tablet Take 20 mg by mouth 3 (three) times daily.    Marland Kitchen atenolol (TENORMIN) 25 MG tablet Take 1 tablet (25 mg total) by mouth daily. 90 tablet 3  . carbamazepine (TEGRETOL) 200 MG tablet carbamazepine 200 mg tablet  TK 1 TO 1.5 TS PO QHS    . cyclobenzaprine (FLEXERIL) 5 MG tablet Take 1-2 tablets (5-10 mg total) by mouth 3 (three) times daily as needed for muscle spasms. 60 tablet 1  . doxycycline (VIBRA-TABS) 100 MG tablet Take 1 tablet (100 mg total) by mouth 2 (two) times daily. 20 tablet 0  . ethynodiol-ethinyl estradiol (KELNOR,ZOVIA) 1-35 MG-MCG tablet Take 1  tablet by mouth daily. 3 Package 4  . hydrocortisone (ANUSOL-HC) 2.5 % rectal cream Place 1 application rectally 2 (two) times daily. 30 g 1  . ibuprofen (ADVIL,MOTRIN) 800 MG tablet Take 1 tablet (800 mg total) by mouth every 8 (eight) hours as needed. 30 tablet 0  . metroNIDAZOLE (FLAGYL) 500 MG tablet Take 1 tablet (500 mg total) by mouth 2 (two) times daily. 14 tablet 1  . mupirocin ointment (BACTROBAN) 2 % Place 1 application into the nose 2 (two) times daily. 22 g 0  . triamcinolone cream (KENALOG) 0.5 % Apply 1 application topically 2 (two) times daily. 45 g 1  . triamcinolone cream (KENALOG) 0.5 % Apply 1 application topically 2 (two) times daily. 45 g 0  . valACYclovir (VALTREX) 1000 MG tablet Take 1/2 tablet daily for suppression, then 1/2 tablet twice daily for 3 days as needed for outbreaks 30 tablet 12   No current facility-administered medications on file prior to visit.     Allergies  Allergen Reactions  . Sulfa Antibiotics Nausea Only  . Codeine Other (See Comments)  . Hydrocodone Other (See Comments)    Past Medical History:  Diagnosis Date  . Allergy   . Anxiety   . 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 Smoking status: Never Smoker  . Smokeless tobacco: Never Used  Substance Use Topics  . Alcohol use: Yes    Alcohol/week: 0.0 oz    Comment: 1-2 night  . Drug use: No   family history is not on file.     Objective:  BP 110/70   Pulse (!) 104   Temp 98.9 F (37.2 C) (Oral)   Resp 18   Ht 5' 5.55" (1.665 m)   Wt 135 lb 9.6 oz (61.5 kg)   SpO2 100%   BMI 22.19 kg/m  Body mass index is 22.19 kg/m.  Wt Readings from Last 3 Encounters:  10/21/17 135 lb 9.6 oz (61.5 kg)  10/14/17 135 lb 9.6 oz (61.5 kg)  06/10/17 136 lb 3.2 oz (61.8 kg)    Physical Exam  Constitutional: She is oriented to person, place, and time. She appears well-developed and  well-nourished.  HENT:  Head: Normocephalic and atraumatic.  Right Ear: Hearing and external ear normal.  Left Ear: Hearing and external ear normal.  Eyes: Conjunctivae are normal.  Neck: Normal range of motion.  Pulmonary/Chest: Effort normal.  Neurological: She is alert and oriented to person, place, and time.  Skin: Skin is warm, dry and intact. Rash (multiple single vesicles on other parts of body - left leg, right arm, abdomin) noted. There is erythema.  wund on right shin is now 1.5cm horizontal compared to 1cm last visit.  The length of the wound is the same.  There is not surrounding erythema and still some clear yellow oozing.  Psychiatric: She has a normal mood and affect. Her behavior is normal. Judgment and thought content normal.  Vitals reviewed.   Assessment and Plan :  Rash and nonspecific skin eruption - Plan: Ambulatory referral to Dermatology -I am really not sure why the wound is not responding to oral antibiotics.  Also not sure with this new rash is.  She does have high anxiety and she does admit to history of picking but feels like she is doing much better job this time not picking at these lesions.  She is continue to keep the wound on her shin covered.  Her biopsy showed an allergic reaction which I thought was related to continue his antifungal cream usage.  At this point she needs to see a dermatologist for further evaluation and treatment.  [name] PA-C  [facility] 10/21/2017 7:12 PM

## 2017-10-25 ENCOUNTER — Telehealth: Payer: Self-pay | Admitting: Physician Assistant

## 2017-10-25 MED ORDER — DOXYCYCLINE HYCLATE 100 MG PO TABS: 100.0000 mg | ORAL_TABLET | Freq: Two times a day (BID) | ORAL | 0 refills | Status: DC

## 2017-10-25 NOTE — Telephone Encounter (Signed)
Pt called stating that she has misplaced the last of her doxycyline.  She states that she had about two or three days left of this round of antibiotics and she has noticed that the cellulitis is still not clearing up.  Pt uses the [facility] on [address].  Please advise  [phone]

## 2017-10-25 NOTE — Telephone Encounter (Signed)
Please advise 

## 2017-11-06 ENCOUNTER — Telehealth: Payer: Self-pay | Admitting: Physician Assistant

## 2017-11-06 NOTE — Telephone Encounter (Signed)
Copied from [place] [id number]. Topic: Quick Communication - See Telephone Encounter >> Nov 06, 2017  6:13 PM Ivar Drape wrote: CRM for notification. See Telephone encounter for: 11/06/17. Patient stated that the doxycycline (VIBRA-TABS) 100 MG tablet medication that was prescribed to her is not working.  The spots on her body is still coming out all over her body, a big one on the left side right above tailbone. Please prescribe something else.  Also, the dermatologist she was referred to has retired and she can't get an appt in the practice until September, but she needs to see someone right away.  Please refer another dermatologist.

## 2017-11-07 NOTE — Telephone Encounter (Signed)
Lindsey Roch do you want to call in another antibiotic?    Spoke with patient gave her number to different dermatolgist. [phone] patient was advised to wait a couple hours until referrals can get all her records sent. Patient voiced understanding.

## 2017-11-10 NOTE — Telephone Encounter (Signed)
Pt sent to [facility] and [employer] and [facility]myra Free advised pt of phone number on 6/28

## 2017-11-10 NOTE — Telephone Encounter (Signed)
No more abx.  She can see anyone in dermatology - try [facility] or Tafeen who is with [facility].

## 2017-12-03 DIAGNOSIS — L258 Unspecified contact dermatitis due to other agents: Secondary | ICD-10-CM | POA: Diagnosis not present

## 2017-12-29 DIAGNOSIS — L988 Other specified disorders of the skin and subcutaneous tissue: Secondary | ICD-10-CM | POA: Diagnosis not present

## 2017-12-29 DIAGNOSIS — L308 Other specified dermatitis: Secondary | ICD-10-CM | POA: Diagnosis not present

## 2018-01-16 DIAGNOSIS — L258 Unspecified contact dermatitis due to other agents: Secondary | ICD-10-CM | POA: Diagnosis not present

## 2018-01-22 ENCOUNTER — Encounter: Payer: Self-pay | Admitting: Physician Assistant

## 2018-02-12 DIAGNOSIS — F3111 Bipolar disorder, current episode manic without psychotic features, mild: Secondary | ICD-10-CM | POA: Diagnosis not present

## 2018-02-12 DIAGNOSIS — F3131 Bipolar disorder, current episode depressed, mild: Secondary | ICD-10-CM | POA: Diagnosis not present

## 2018-02-12 DIAGNOSIS — F410 Panic disorder [episodic paroxysmal anxiety] without agoraphobia: Secondary | ICD-10-CM | POA: Diagnosis not present

## 2018-02-12 DIAGNOSIS — F900 Attention-deficit hyperactivity disorder, predominantly inattentive type: Secondary | ICD-10-CM | POA: Diagnosis not present

## 2018-03-10 DIAGNOSIS — R21 Rash and other nonspecific skin eruption: Secondary | ICD-10-CM | POA: Diagnosis not present

## 2018-03-10 DIAGNOSIS — A6000 Herpesviral infection of urogenital system, unspecified: Secondary | ICD-10-CM | POA: Diagnosis not present

## 2018-03-10 DIAGNOSIS — F319 Bipolar disorder, unspecified: Secondary | ICD-10-CM | POA: Diagnosis not present

## 2018-03-10 DIAGNOSIS — N926 Irregular menstruation, unspecified: Secondary | ICD-10-CM | POA: Diagnosis not present

## 2018-03-10 DIAGNOSIS — D219 Benign neoplasm of connective and other soft tissue, unspecified: Secondary | ICD-10-CM | POA: Diagnosis not present

## 2018-03-16 ENCOUNTER — Other Ambulatory Visit: Payer: Self-pay

## 2018-03-16 ENCOUNTER — Ambulatory Visit: Payer: BLUE CROSS/BLUE SHIELD | Admitting: Obstetrics and Gynecology

## 2018-03-16 ENCOUNTER — Encounter: Payer: Self-pay | Admitting: Obstetrics and Gynecology

## 2018-03-16 VITALS — BP 122/84 | HR 88 | Ht 65.75 in | Wt 144.4 lb

## 2018-03-16 DIAGNOSIS — R32 Unspecified urinary incontinence: Secondary | ICD-10-CM | POA: Diagnosis not present

## 2018-03-16 DIAGNOSIS — N852 Hypertrophy of uterus: Secondary | ICD-10-CM

## 2018-03-16 DIAGNOSIS — Z862 Personal history of diseases of the blood and blood-forming organs and certain disorders involving the immune mechanism: Secondary | ICD-10-CM

## 2018-03-16 DIAGNOSIS — N921 Excessive and frequent menstruation with irregular cycle: Secondary | ICD-10-CM | POA: Diagnosis not present

## 2018-03-16 DIAGNOSIS — N858 Other specified noninflammatory disorders of uterus: Secondary | ICD-10-CM | POA: Diagnosis not present

## 2018-03-16 NOTE — Patient Instructions (Signed)
Uterine Fibroids Uterine fibroids are tissue masses (tumors) that can develop in the womb (uterus). They are also called leiomyomas. This type of tumor is not cancerous (benign) and does not spread to other parts of the body outside of the pelvic area, which is between the hip bones. Occasionally, fibroids may develop in the fallopian tubes, in the cervix, or on the support structures (ligaments) that surround the uterus. You can have one or many fibroids. Fibroids can vary in size, weight, and where they grow in the uterus. Some can become quite large. Most fibroids do not require medical treatment. What are the causes? A fibroid can develop when a single uterine cell keeps growing (replicating). Most cells in the human body have a control mechanism that keeps them from replicating without control. What are the signs or symptoms? Symptoms may include:  Heavy bleeding during your period.  Bleeding or spotting between periods.  Pelvic pain and pressure.  Bladder problems, such as needing to urinate more often (urinary frequency) or urgently.  Inability to reproduce offspring (infertility).  Miscarriages.  How is this diagnosed? Uterine fibroids are diagnosed through a physical exam. Your health care provider may feel the lumpy tumors during a pelvic exam. Ultrasonography and an MRI may be done to determine the size, location, and number of fibroids. How is this treated? Treatment may include:  Watchful waiting. This involves getting the fibroid checked by your health care provider to see if it grows or shrinks. Follow your health care provider's recommendations for how often to have this checked.  Hormone medicines. These can be taken by mouth or given through an intrauterine device (IUD).  Surgery. ? Removing the fibroids (myomectomy) or the uterus (hysterectomy). ? Removing blood supply to the fibroids (uterine artery embolization).  If fibroids interfere with your fertility and you  want to become pregnant, your health care provider may recommend having the fibroids removed. Follow these instructions at home:  Keep all follow-up visits as directed by your health care provider. This is important.  Take over-the-counter and prescription medicines only as told by your health care provider. ? If you were prescribed a hormone treatment, take the hormone medicines exactly as directed.  Ask your health care provider about taking iron pills and increasing the amount of dark green, leafy vegetables in your diet. These actions can help to boost your blood iron levels, which may be affected by heavy menstrual bleeding.  Pay close attention to your period and tell your health care provider about any changes, such as: ? Increased blood flow that requires you to use more pads or tampons than usual per month. ? A change in the number of days that your period lasts per month. ? A change in symptoms that are associated with your period, such as abdominal cramping or back pain. Contact a health care provider if:  You have pelvic pain, back pain, or abdominal cramps that cannot be controlled with medicines.  You have an increase in bleeding between and during periods.  You soak tampons or pads in a half hour or less.  You feel lightheaded, extra tired, or weak. Get help right away if:  You faint.  You have a sudden increase in pelvic pain. This information is not intended to replace advice given to you by your health care provider. Make sure you discuss any questions you have with your health care provider. Document Released: 04/26/2000 Document Revised: 12/28/2015 Document Reviewed: 10/26/2013 Elsevier Interactive Patient Education  2018 Elsevier Inc.  

## 2018-03-16 NOTE — Progress Notes (Addendum)
47 y.o. [id number] Divorced White or Caucasian Not Hispanic or Latino female here for irregular bleeding on OCP's. She has been bleeding constantly for a year. Varies from spotting to very heavy. At the most she can go through a super + tampon in up to 15 minute with large clots. Moderate cramps, 6-8/10 in severity, she takes flexeril for neck pain and uterine cramps. She has been on OCP's for years. Cycles have always been heavy. Prior to the last year she was having monthly cycles. She is sometimes skipping the placebo pills. Just varies from month to month. She has tried numerous different pills. This pill worked for a while.  She c/o bladder pressure. She leaks with valsalva, with sex, with walking. No real urge incontinence. Leaking urine on a daily basis, needs to wear a liner. Currently she is wearing depends because her bleeding has been so bad.  [place] 51 in 1/19. Normal pap. Hgb 9.5, low iron stores. She is on multivitamins and an iron pill. Not light headed or dizzy.  Sexually active, long term partner, no dyspareunia.    Period Duration (Days): ongoing bleeding for over a year Period Pattern: (!) Irregular Menstrual Flow: Heavy, Moderate, Light Menstrual Control: Tampon, Other (Comment)(depends pad) Menstrual Control Change Freq (Hours): changing tampon every hour Dysmenorrhea: (!) Mild Dysmenorrhea Symptoms: Cramping  No LMP recorded. (Menstrual status: Irregular Periods).          Sexually active: Yes.    The current method of family planning is OCP (estrogen/progesterone).    Exercising: No.  The patient does not participate in regular exercise at present. Smoker:  no  Health Maintenance: Pap: 06/10/2017 WNL History of abnormal Pap:  No MMG:  Never Colonoscopy: Never TDaP:  Due in 2020, done with PCP Gardasil: N/A   reports that she has never smoked. She has never used smokeless tobacco. She reports that she drinks about 4.0 - 6.0 standard drinks of alcohol per week. She reports  that she does not use drugs. 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 Allergy   . Anemia   . Anxiety   . Dysmenorrhea   . Fibroid   . Heart murmur   . History of mania   . Hormone disorder   . STD (sexually transmitted disease)   . 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   Past Surgical History:  Procedure Laterality Date  . APPENDECTOMY    . BREAST REDUCTION SURGERY Bilateral   . BREAST SURGERY    . CESAREAN SECTION    . COSMETIC SURGERY    C/S at 32 weeks, h/o gastroschisis. Now 25  Current Outpatient Medications  Medication Sig Dispense Refill  . ALPRAZolam (XANAX) 1 MG tablet TAKE ONE TABLET BY MOUTH QID  5  . amphetamine-dextroamphetamine (ADDERALL) 20 MG tablet Take 20 mg by mouth 3 (three) times daily.    Marland Kitchen atenolol (TENORMIN) 25 MG tablet Take 1 tablet (25 mg total) by mouth daily. 90 tablet 3  . carbamazepine (TEGRETOL) 200 MG tablet carbamazepine 200 mg tablet  TK 1 TO 1.5 TS PO QHS    . cyclobenzaprine (FLEXERIL) 5 MG tablet Take 1-2 tablets (5-10 mg total) by mouth 3 (three) times daily as needed for muscle spasms. 60 tablet 1  . doxycycline (VIBRA-TABS) 100 MG tablet Take 1 tablet (100 mg total) by mouth 2 (two) times  daily. 20 tablet 0  . ethynodiol-ethinyl estradiol (KELNOR,ZOVIA) 1-35 MG-MCG tablet Take 1 tablet by mouth daily. 3 Package 4  . hydrocortisone (ANUSOL-HC) 2.5 % rectal cream Place 1 application rectally 2 (two) times daily. 30 g 1  . ibuprofen (ADVIL,MOTRIN) 800 MG tablet Take 1 tablet (800 mg total) by mouth every 8 (eight) hours as needed. 30 tablet 0  . valACYclovir (VALTREX) 1000 MG tablet Take 1/2 tablet daily for suppression, then 1/2 tablet twice daily for 3 days as needed for outbreaks 30 tablet 12   No current facility-administered medications for this visit.     History reviewed. No pertinent family history.  Review of Systems   Constitutional: Negative.   HENT: Negative.   Eyes: Negative.   Respiratory: Negative.   Cardiovascular: Negative.   Endocrine: Negative.   Genitourinary: Positive for menstrual problem and vaginal bleeding.  Musculoskeletal: Negative.   Skin: Positive for rash.  Neurological: Negative.   Hematological: Negative.   Psychiatric/Behavioral: Negative.     Exam:   BP 122/84 (BP Location: Right Arm, Patient Position: Sitting, Cuff Size: Normal)   Pulse 88   Ht 5' 5.75" (1.67 m)   Wt 144 lb 6.4 oz (65.5 kg)   BMI 23.49 kg/m   Weight change: @WEIGHTCHANGE @ Height:   Height: 5' 5.75" (167 cm)  Ht Readings from Last 3 Encounters:  03/16/18 5' 5.75" (1.67 m)  10/21/17 5' 5.55" (1.665 m)  10/14/17 5' 5.55" (1.665 m)    General appearance: alert, cooperative and appears stated age Head: Normocephalic, without obvious abnormality, atraumatic Neck: no adenopathy, supple, symmetrical, trachea midline and thyroid normal to inspection and palpation Lungs: clear to auscultation bilaterally Cardiovascular: regular rate and rhythm Abdomen: soft, non-tender; non distended,  Uterus palpated in her lower abdomen in the midline Extremities: extremities normal, atraumatic, no cyanosis or edema Skin: Skin color, texture, turgor normal. No rashes or lesions Lymph nodes: Cervical nodes normal. No abnormal inguinal nodes palpated Neurologic: Grossly normal   Pelvic: External genitalia:  no lesions              Urethra:  normal appearing urethra with no masses, tenderness or lesions              Bartholins and Skenes: normal                 Vagina: normal appearing vagina with normal color and discharge, no lesions              Cervix: no cervical motion tenderness and no lesions               Bimanual Exam:  Uterus:  anteverted, mobile, 12 week sized, not tender.               Adnexa: no mass, fullness, tenderness               Rectovaginal: Confirms               Anus:  normal sphincter tone,  no lesions  The risks of endometrial biopsy were reviewed and a consent was obtained.  A speculum was placed in the vagina and the cervix was cleansed with betadine. A tenaculum was placed on the cervix and the mini-pipelle was placed into the endometrial cavity. The uterus sounded to 12 cm. The endometrial biopsy was performed, moderate tissue was obtained. The procedure was repeated with the uterine evacuator, a large amount of blood/clot was removed. The tenaculum and speculum were removed. There were  no complications.    Chaperone was present for exam.  A:  Menometrorrhagia on OCP's  Bleeding very heavily intermittently  H/O anemia  Urinary incontinence, sounds mostly stress by history  P:   UPT  CBC, Feritin  Endometrial biopsy  Return for ultrasound  Return for urodynamic studies  The patient desires hysterectomy    CC: [name], PA-C  Addendum to reflect that this was a consultation from the patient's primary [name]. A note was sent to the primary.

## 2018-03-17 ENCOUNTER — Encounter (HOSPITAL_COMMUNITY): Payer: Self-pay

## 2018-03-17 ENCOUNTER — Other Ambulatory Visit: Payer: BLUE CROSS/BLUE SHIELD | Admitting: Obstetrics and Gynecology

## 2018-03-17 ENCOUNTER — Other Ambulatory Visit: Payer: Self-pay

## 2018-03-17 ENCOUNTER — Telehealth: Payer: Self-pay | Admitting: *Deleted

## 2018-03-17 ENCOUNTER — Other Ambulatory Visit: Payer: BLUE CROSS/BLUE SHIELD

## 2018-03-17 ENCOUNTER — Emergency Department (HOSPITAL_COMMUNITY)
Admission: EM | Admit: 2018-03-17 | Discharge: 2018-03-17 | Disposition: A | Discharge status: Home / Self Care | Payer: BLUE CROSS/BLUE SHIELD | Attending: Emergency Medicine | Admitting: Emergency Medicine

## 2018-03-17 DIAGNOSIS — I471 Supraventricular tachycardia: Secondary | ICD-10-CM | POA: Diagnosis not present

## 2018-03-17 DIAGNOSIS — D649 Anemia, unspecified: Secondary | ICD-10-CM | POA: Diagnosis not present

## 2018-03-17 DIAGNOSIS — D509 Iron deficiency anemia, unspecified: Secondary | ICD-10-CM

## 2018-03-17 DIAGNOSIS — Z79899 Other long term (current) drug therapy: Secondary | ICD-10-CM | POA: Insufficient documentation

## 2018-03-17 DIAGNOSIS — R32 Unspecified urinary incontinence: Secondary | ICD-10-CM

## 2018-03-17 DIAGNOSIS — R000 Tachycardia, unspecified: Secondary | ICD-10-CM | POA: Diagnosis not present

## 2018-03-17 LAB — CBC WITH DIFFERENTIAL/PLATELET
Abs Immature Granulocytes: 0.01 10*3/uL (ref 0.00–0.07)
BASOS ABS: 0 10*3/uL (ref 0.0–0.1)
Basophils Relative: 1 %
EOS ABS: 0.2 10*3/uL (ref 0.0–0.5)
EOS PCT: 3 %
HEMATOCRIT: 30.7 % — AB (ref 36.0–46.0)
HEMOGLOBIN: 8.9 g/dL — AB (ref 12.0–15.0)
Immature Granulocytes: 0 %
LYMPHS ABS: 1.4 10*3/uL (ref 0.7–4.0)
LYMPHS PCT: 21 %
MCH: 25.9 pg — AB (ref 26.0–34.0)
MCHC: 29 g/dL — AB (ref 30.0–36.0)
MCV: 89.5 fL (ref 80.0–100.0)
MONO ABS: 0.6 10*3/uL (ref 0.1–1.0)
Monocytes Relative: 9 %
NRBC: 0 % (ref 0.0–0.2)
Neutro Abs: 4.3 10*3/uL (ref 1.7–7.7)
Neutrophils Relative %: 66 %
Platelets: 287 10*3/uL (ref 150–400)
RBC: 3.43 MIL/uL — AB (ref 3.87–5.11)
RDW: 19.1 % — AB (ref 11.5–15.5)
WBC: 6.6 10*3/uL (ref 4.0–10.5)

## 2018-03-17 LAB — CBC
HEMOGLOBIN: 9.2 g/dL — AB (ref 11.1–15.9)
Hematocrit: 28.9 % — ABNORMAL LOW (ref 34.0–46.6)
MCH: 27.1 pg (ref 26.6–33.0)
MCHC: 31.8 g/dL (ref 31.5–35.7)
MCV: 85 fL (ref 79–97)
Platelets: 314 10*3/uL (ref 150–450)
RBC: 3.39 x10E6/uL — AB (ref 3.77–5.28)
RDW: 18.8 % — ABNORMAL HIGH (ref 12.3–15.4)
WBC: 6 10*3/uL (ref 3.4–10.8)

## 2018-03-17 LAB — BASIC METABOLIC PANEL
Anion gap: 14 (ref 5–15)
BUN: 9 mg/dL (ref 6–20)
CALCIUM: 8.8 mg/dL — AB (ref 8.9–10.3)
CHLORIDE: 104 mmol/L (ref 98–111)
CO2: 20 mmol/L — AB (ref 22–32)
CREATININE: 0.87 mg/dL (ref 0.44–1.00)
GFR calc non Af Amer: >60 mL/min (ref >60)
Glucose, Bld: 76 mg/dL (ref 70–99)
Potassium: 3.3 mmol/L — ABNORMAL LOW (ref 3.5–5.1)
Sodium: 138 mmol/L (ref 135–145)

## 2018-03-17 LAB — FERRITIN: Ferritin: 7 ng/mL — ABNORMAL LOW (ref 15–150)

## 2018-03-17 LAB — TSH: TSH: 1.95 u[IU]/mL (ref 0.450–4.500)

## 2018-03-17 LAB — TROPONIN I: Troponin I: <0.03 ng/mL (ref <0.03)

## 2018-03-17 MED ORDER — ATENOLOL 25 MG PO TABS: 12.5000 mg | ORAL_TABLET | Freq: Every day | ORAL | 1 refills | Status: DC

## 2018-03-17 NOTE — ED Provider Notes (Signed)
[facility] EMERGENCY DEPARTMENT Provider Note   CSN: [id number] Arrival date & time: 03/17/18  1528     History   Chief Complaint Chief Complaint  Patient presents with  . Tachycardia    HPI Lindsey Adams is a 47 y.o. female.  The history is provided by the patient. No language interpreter was used.  Palpitations   This is a recurrent problem. The current episode started less than 1 hour ago. The problem has been resolved. Associated symptoms include syncope. Pertinent negatives include no chest pressure and no cough. The treatment provided significant relief.  Pt has a history of SVT.  Pt reports she has a prescription for atenolol but she does not take because it drops her blood pressure to low and she feels bad.  Pt reports she has frequent episodes.  She normally uses vagal maneuvers. Pt states she usually does not come to the hospital but she was at her doctors office and it happened. Pt reports she has uterine fibroids and anemia and she is suppose to have surgery.  Pt is suppose to start iron infusions and needs to see cardiology before surgery.  Pt reports she plans to see Dr. Aundra Dubin.  Pt was given 6mg  of adenosine by EMS.  Pt reports she feels normal now.   Past Medical History:  Diagnosis Date  . Allergy   . Anemia   . Anxiety   . Dysmenorrhea   . Fibroid   . Heart murmur   . History of mania   . Hormone disorder   . STD (sexually transmitted disease)   . SVT (supraventricular tachycardia) ([place])   . Urinary incontinence     Patient Active Problem List   Diagnosis Date Noted  . 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 SVT (supraventricular tachycardia) ([place]) 08/08/2014  . ADD (attention deficit disorder) 08/08/2014  . Generalized anxiety disorder 08/08/2014    Past Surgical History:  Procedure Laterality Date  . APPENDECTOMY    . BREAST REDUCTION SURGERY Bilateral   . BREAST SURGERY    . CESAREAN  SECTION    . COSMETIC SURGERY    . NASAL SINUS SURGERY       OB History    Gravida  4   Para  1   Term  0   Preterm  1   AB  2   Living  1     SAB  1   TAB  1   Ectopic      Multiple      Live Births  1            Home Medications    Prior to Admission medications   Medication Sig Start Date End Date Taking? Authorizing Provider  ALPRAZolam Duanne Moron) 1 MG tablet TAKE ONE TABLET BY MOUTH QID 10/11/17   [provider]  amphetamine-dextroamphetamine (ADDERALL) 20 MG tablet Take 20 mg by mouth 3 (three) times daily.    [provider]  atenolol (TENORMIN) 25 MG tablet Take 1 tablet (25 mg total) by mouth daily. 08/08/14   Gale Journey, Damaris Hippo, PA-C  carbamazepine (TEGRETOL) 200 MG tablet carbamazepine 200 mg tablet  TK 1 TO 1.5 TS PO QHS    [provider]  cyclobenzaprine (FLEXERIL) 5 MG tablet Take 1-2 tablets (5-10 mg total) by mouth 3 (three) times daily as needed for muscle spasms. 06/10/17   Weber, Damaris Hippo, PA-C  doxycycline (VIBRA-TABS) 100 MG tablet Take  1 tablet (100 mg total) by mouth 2 (two) times daily. 10/25/17   Weber, Damaris Hippo, PA-C  ethynodiol-ethinyl estradiol Celso Sickle) 1-35 MG-MCG tablet Take 1 tablet by mouth daily. 10/14/17   Weber, Damaris Hippo, PA-C  hydrocortisone (ANUSOL-HC) 2.5 % rectal cream Place 1 application rectally 2 (two) times daily. 06/10/17   Weber, Damaris Hippo, PA-C  ibuprofen (ADVIL,MOTRIN) 800 MG tablet Take 1 tablet (800 mg total) by mouth every 8 (eight) hours as needed. 05/22/17   Forrest Moron, MD  valACYclovir (VALTREX) 1000 MG tablet Take 1/2 tablet daily for suppression, then 1/2 tablet twice daily for 3 days as needed for outbreaks 06/10/17   Gale Journey, Damaris Hippo, PA-C    Family History History reviewed. No pertinent family history.  Social History Social History   Tobacco Use  . Smoking status: Never Smoker  . Smokeless tobacco: Never Used  Substance Use Topics  . Alcohol use: Yes    Alcohol/week: 4.0 - 6.0  standard drinks    Types: 4 - 6 Standard drinks or equivalent per week  . Drug use: No     Allergies   Sulfa antibiotics; Codeine; and Hydrocodone   Review of Systems Review of Systems  Respiratory: Negative for cough.   Cardiovascular: Positive for palpitations and syncope.  All other systems reviewed and are negative.    Physical Exam Updated Vital Signs BP (!) 139/96 (BP Location: Right Arm)   Pulse (!) 117   Temp 98.3 F (36.8 C) (Oral)   Resp 18   Ht 5\' 6"  (1.676 m)   Wt 65.3 kg   SpO2 100%   BMI 23.24 kg/m   Physical Exam  Constitutional: She appears well-developed.  HENT:  Head: Normocephalic.  Eyes: Pupils are equal, round, and reactive to light.  Neck: Normal range of motion.  Cardiovascular: Normal rate.  Pulmonary/Chest: Effort normal. She exhibits tenderness.  Abdominal: Soft.  Musculoskeletal: Normal range of motion.  Neurological: She is alert.  Skin: Skin is warm.  Psychiatric: She has a normal mood and affect.  Nursing note and vitals reviewed.    ED Treatments / Results  Labs (all labs ordered are listed, but only abnormal results are displayed) Labs Reviewed - No data to display  EKG EKG Interpretation  Date/Time:  Tuesday March 17 2018 15:32:45 EST Ventricular Rate:  113 PR Interval:    QRS Duration: 83 QT Interval:  344 QTC Calculation: 472 R Axis:   67 Text Interpretation:  Sinus tachycardia Probable left atrial enlargement RSR' in V1 or V2, probably normal variant Since last tracing rate faster Confirmed by Isla Pence [phone]) on 03/17/2018 3:36:50 PM   Radiology No results found.  Procedures Procedures (including critical care time)  Medications Ordered in ED Medications - No data to display   Initial Impression / Assessment and Plan / ED Course  I have reviewed the triage vital signs and the nursing notes.  Pertinent labs & imaging results that were available during my care of the patient were reviewed by me  and considered in my medical decision making (see chart for details).     MDM Hemoglobin is 8.9 troponin is normal.  I will try pt on a lower dose of atenolol.  Pt advised to keep appointments as scheduled for treatment of anemia.  Pt given number for [facility] cardiology.    Final Clinical Impressions(s) / ED Diagnoses   Final diagnoses:  SVT (supraventricular tachycardia) (HCC)  Anemia, unspecified type    ED Discharge Orders  Ordered    atenolol (TENORMIN) 25 MG tablet  Daily     03/17/18 1950        An After Visit Summary was printed and given to the patient.    Fransico Meadow, PA-C 03/17/18 2317    Isla Pence, MD 03/17/18 [phone]

## 2018-03-17 NOTE — ED Triage Notes (Signed)
Per [facility], pt from doctors office for w/  C/o SVT and a HR of 220 bpm. Pt was a-symptomatic - no CP, no lightheadedness, dizziness, or N/V. Tx included 22 ga IV and 6 mg of Adenosine. Pt cnverted after 1st dose with a HR of 115 bpm. BP 132/84, SPO2 100, and RR 22.

## 2018-03-17 NOTE — Discharge Instructions (Addendum)
Follow up with your Physician for recheck.  Try small dosage of atenolol.  Schedule to see cardiology for evalution

## 2018-03-17 NOTE — Telephone Encounter (Addendum)
Late entry: 2:50PM  Patient arrive at office for check in. Patient told receptionist that she was in SVT and need to sit down. RN notified immediately, patient assisted to exam room. B/P 152/110, HR 220's. Patient lips were blue, skin warm and dry. No SOB, wheezing or difficulty breathing. Patient A/O x 4. Dr. Talbert Nan called to exam room for evaluation, was instructed to call EMS. EMS called.   Routing to Dr. Talbert Nan.

## 2018-03-18 ENCOUNTER — Telehealth: Payer: Self-pay | Admitting: *Deleted

## 2018-03-18 DIAGNOSIS — H2513 Age-related nuclear cataract, bilateral: Secondary | ICD-10-CM | POA: Diagnosis not present

## 2018-03-18 DIAGNOSIS — H524 Presbyopia: Secondary | ICD-10-CM | POA: Diagnosis not present

## 2018-03-18 DIAGNOSIS — H04123 Dry eye syndrome of bilateral lacrimal glands: Secondary | ICD-10-CM | POA: Diagnosis not present

## 2018-03-18 DIAGNOSIS — H02839 Dermatochalasis of unspecified eye, unspecified eyelid: Secondary | ICD-10-CM | POA: Diagnosis not present

## 2018-03-18 DIAGNOSIS — H25013 Cortical age-related cataract, bilateral: Secondary | ICD-10-CM | POA: Diagnosis not present

## 2018-03-18 NOTE — Telephone Encounter (Signed)
Patient is calling to reschedule her PUS from yesterday.

## 2018-03-18 NOTE — Telephone Encounter (Signed)
Patient notified of hematology appointment on 03-24-18 at [place] at [facility], [place]. Patietn is aware iron infusion will be scheduled at a separate appointment.

## 2018-03-18 NOTE — Telephone Encounter (Signed)
Late entry: I was called to see the patient. She was lying down, normal color. She c/o palpitations, but denied SOB, chest pain, or any other concerns. HR in the 220's, BP 152/110.  EMS was called, arrived and took the patient to the hospital.  I advised the patient that she would need to see her cardiologist for approval prior to surgery.

## 2018-03-18 NOTE — Telephone Encounter (Signed)
Call to patient. States she was discharged from hospital last night around 8 pm after observation.  No further issues with SVT today. Has cardiology appointment in am. Pelvic ultrasound rescheduled to 03-24-18.  Urodynamics scheduled for 03-25-18.  Surgery planned for 04-20-18 pending results, labs and cardiology clearance.

## 2018-03-18 NOTE — Telephone Encounter (Signed)
Patient is ready to reschedule her ultrasound.

## 2018-03-18 NOTE — Progress Notes (Signed)
Error:  The patient was scheduled to see me.  I talked to her and reviewed data.  She needs SVT ablation and Dr. Lovena Le will see her today.

## 2018-03-19 ENCOUNTER — Encounter: Payer: Self-pay | Admitting: Cardiology

## 2018-03-19 ENCOUNTER — Other Ambulatory Visit: Payer: Self-pay

## 2018-03-19 ENCOUNTER — Encounter: Payer: Self-pay | Admitting: Internal Medicine

## 2018-03-19 ENCOUNTER — Ambulatory Visit (INDEPENDENT_AMBULATORY_CARE_PROVIDER_SITE_OTHER): Payer: BLUE CROSS/BLUE SHIELD | Admitting: Internal Medicine

## 2018-03-19 ENCOUNTER — Ambulatory Visit: Payer: BLUE CROSS/BLUE SHIELD | Admitting: Cardiology

## 2018-03-19 ENCOUNTER — Encounter: Payer: BLUE CROSS/BLUE SHIELD | Admitting: Cardiology

## 2018-03-19 ENCOUNTER — Other Ambulatory Visit: Payer: Self-pay | Admitting: *Deleted

## 2018-03-19 VITALS — BP 144/90 | HR 85 | Ht 66.0 in | Wt 146.0 lb

## 2018-03-19 VITALS — BP 130/80 | HR 74 | Ht 65.0 in | Wt 146.0 lb

## 2018-03-19 DIAGNOSIS — N921 Excessive and frequent menstruation with irregular cycle: Secondary | ICD-10-CM

## 2018-03-19 DIAGNOSIS — I471 Supraventricular tachycardia: Secondary | ICD-10-CM | POA: Diagnosis not present

## 2018-03-19 DIAGNOSIS — Z01812 Encounter for preprocedural laboratory examination: Secondary | ICD-10-CM | POA: Diagnosis not present

## 2018-03-19 DIAGNOSIS — Z862 Personal history of diseases of the blood and blood-forming organs and certain disorders involving the immune mechanism: Secondary | ICD-10-CM

## 2018-03-19 NOTE — Patient Instructions (Addendum)
Medication Instructions:  Your physician recommends that you continue on your current medications as directed. Please refer to the Current Medication list given to you today.  If you need a refill on your cardiac medications before your next appointment, please call your pharmacy.   Lab work: Your physician recommends that you return for lab work on: 04/06/18  If you have labs (blood work) drawn today and your tests are completely normal, you will receive your results only by: Marland Kitchen MyChart Message (if you have MyChart) OR . A paper copy in the mail If you have any lab test that is abnormal or we need to change your treatment, we will call you to review the results.  Testing/Procedures: Your physician has recommended that you have an ablation. Catheter ablation is a medical procedure used to treat some cardiac arrhythmias (irregular heartbeats). During catheter ablation, a long, thin, flexible tube is put into a blood vessel in your groin (upper thigh), or neck. This tube is called an ablation catheter. It is then guided to your heart through the blood vessel. Radio frequency waves destroy small areas of heart tissue where abnormal heartbeats may cause an arrhythmia to start.  Instructions for your ablation: 1. Please arrive at the [facility], Main Entrance "A", of [facility] at 7:30 a.m on 04/06/18. 2. Do not eat or drink after midnight the night prior to the procedure. 3. Do not take any medications the morning of the procedure. 4. Plan for an overnight stay in the hospital. 5. You will need someone to drive you home at discharge.  Follow-Up: Your physician recommends that you schedule a follow-up appointment in: 4 weeks, after your procedure on 04/06/18, with Dr. Lovena Le.  Thank you for choosing [facility]!!    Any Other Special Instructions Will Be Listed Below (If Applicable).  Cardiac Ablation Cardiac ablation is a procedure to disable (ablate) a small amount of heart  tissue in very specific places. The heart has many electrical connections. Sometimes these connections are abnormal and can cause the heart to beat very fast or irregularly. Ablating some of the problem areas can improve the heart rhythm or return it to normal. Ablation may be done for people who:  Have Wolff-Parkinson-White syndrome.  Have fast heart rhythms (tachycardia).  Have taken medicines for an abnormal heart rhythm (arrhythmia) that were not effective or caused side effects.  Have a high-risk heartbeat that may be life-threatening.  During the procedure, a small incision is made in the neck or the groin, and a long, thin, flexible tube (catheter) is inserted into the incision and moved to the heart. Small devices (electrodes) on the tip of the catheter will send out electrical currents. A type of X-ray (fluoroscopy) will be used to help guide the catheter and to provide images of the heart. Tell a health care provider about:  Any allergies you have.  All medicines you are taking, including vitamins, herbs, eye drops, creams, and over-the-counter medicines.  Any problems you or family members have had with anesthetic medicines.  Any blood disorders you have.  Any surgeries you have had.  Any medical conditions you have, such as kidney failure.  Whether you are pregnant or may be pregnant. What are the risks? Generally, this is a safe procedure. However, problems may occur, including:  Infection.  Bruising and bleeding at the catheter insertion site.  Bleeding into the chest, especially into the sac that surrounds the heart. This is a serious complication.  Stroke or blood  clots.  Damage to other structures or organs.  Allergic reaction to medicines or dyes.  Need for a permanent pacemaker if the normal electrical system is damaged. A pacemaker is a small computer that sends electrical signals to the heart and helps your heart beat normally.  The procedure not being  fully effective. This may not be recognized until months later. Repeat ablation procedures are sometimes required.  What happens before the procedure?  Follow instructions from your health care provider about eating or drinking restrictions.  Ask your health care provider about: ? Changing or stopping your regular medicines. This is especially important if you are taking diabetes medicines or blood thinners. ? Taking medicines such as aspirin and ibuprofen. These medicines can thin your blood. Do not take these medicines before your procedure if your health care provider instructs you not to.  Plan to have someone take you home from the hospital or clinic.  If you will be going home right after the procedure, plan to have someone with you for 24 hours. What happens during the procedure?  To lower your risk of infection: ? Your health care team will wash or sanitize their hands. ? Your skin will be washed with soap. ? Hair may be removed from the incision area.  An IV tube will be inserted into one of your veins.  You will be given a medicine to help you relax (sedative).  The skin on your neck or groin will be numbed.  An incision will be made in your neck or your groin.  A needle will be inserted through the incision and into a large vein in your neck or groin.  A catheter will be inserted into the needle and moved to your heart.  Dye may be injected through the catheter to help your surgeon see the area of the heart that needs treatment.  Electrical currents will be sent from the catheter to ablate heart tissue in desired areas. There are three types of energy that may be used to ablate heart tissue: ? Heat (radiofrequency energy). ? Laser energy. ? Extreme cold (cryoablation).  When the necessary tissue has been ablated, the catheter will be removed.  Pressure will be held on the catheter insertion area to prevent excessive bleeding.  A bandage (dressing) will be placed  over the catheter insertion area. The procedure may vary among health care providers and hospitals. What happens after the procedure?  Your blood pressure, heart rate, breathing rate, and blood oxygen level will be monitored until the medicines you were given have worn off.  Your catheter insertion area will be monitored for bleeding. You will need to lie still for a few hours to ensure that you do not bleed from the catheter insertion area.  Do not drive for 24 hours or as long as directed by your health care provider. Summary  Cardiac ablation is a procedure to disable (ablate) a small amount of heart tissue in very specific places. Ablating some of the problem areas can improve the heart rhythm or return it to normal.  During the procedure, electrical currents will be sent from the catheter to ablate heart tissue in desired areas. This information is not intended to replace advice given to you by your health care provider. Make sure you discuss any questions you have with your health care provider. Document Released: 09/15/2008 Document Revised: 03/18/2016 Document Reviewed: 03/18/2016 Elsevier Interactive Patient Education  [employer].

## 2018-03-19 NOTE — H&P (View-Only) (Signed)
HPI Lindsey Adams is referred today by [name] for evaluation of SVT. She has had SVT for 20 years. Her episodes have increased in frequency and severity. The longest lasted over 20 hours. She has had 12 in the past year which lasted several hours or more. She has been on medical therapy for several years but this makes her feel lightheaded and fatigued. She has never passed out but at times will get lightheaded. She has sob but no chest pressure with her SVT. The patient usually lies still and her symptoms will eventually stop. She has rates that are typically 210-220 but occaisionally 240/min.  Allergies  Allergen Reactions  . Sulfa Antibiotics Nausea Only  . Codeine Other (See Comments)  . Hydrocodone Other (See Comments)     Current Outpatient Medications  Medication Sig Dispense Refill  . ALPRAZolam (XANAX) 1 MG tablet Take 1 mg by mouth as needed for anxiety.   5  . cyclobenzaprine (FLEXERIL) 5 MG tablet Take 1-2 tablets (5-10 mg total) by mouth 3 (three) times daily as needed for muscle spasms. 60 tablet 1  . QUEtiapine Fumarate (SEROQUEL PO) Take 1 tablet by mouth at bedtime.    . valACYclovir (VALTREX) 1000 MG tablet Take 1/2 tablet daily for suppression, then 1/2 tablet twice daily for 3 days as needed for outbreaks 30 tablet 12  . atenolol (TENORMIN) 25 MG tablet Take 0.5 tablets (12.5 mg total) by mouth daily. (Patient not taking: Reported on 03/19/2018) 90 tablet 1   No current facility-administered medications for this visit.      Past Medical History:  Diagnosis Date  . Allergy   . Anemia   . Anxiety   . Dysmenorrhea   . Fibroid   . Heart murmur   . History of mania   . Hormone disorder   . STD (sexually transmitted disease)   . SVT (supraventricular tachycardia) ([place])   . Urinary incontinence     ROS:   All systems reviewed and negative except as noted in the HPI.   Past Surgical History:  Procedure Laterality Date  . APPENDECTOMY    . BREAST  REDUCTION SURGERY Bilateral   . BREAST SURGERY    . CESAREAN SECTION    . COSMETIC SURGERY    . NASAL SINUS SURGERY       Family History  Problem Relation Age of Onset  . Liver disease Mother   . Heart attack Maternal Grandfather   . Heart disease Maternal Grandfather      Social History   Socioeconomic History  . Marital status: Divorced    Spouse name: Not on file  . Number of children: 1  . Years of education: Not on file  . Highest education level: Not on file  Occupational History  . Not on file  Social Needs  . Financial resource strain: Not on file  . Food insecurity:    Worry: Not on file    Inability: Not on file  . Transportation needs:    Medical: Not on file    Non-medical: Not on file  Tobacco Use  . Smoking status: Never Smoker  . Smokeless tobacco: Never Used  Substance and Sexual Activity  . Alcohol use: Yes    Alcohol/week: 4.0 - 6.0 standard drinks    Types: 4 - 6 Standard drinks or equivalent per week  . Drug use: No  . Sexual activity: Yes    Partners: Male    Birth control/protection: Pill  Lifestyle  . Physical activity:    Days per week: Not on file    Minutes per session: Not on file  . Stress: Not on file  Relationships  . Social connections:    Talks on phone: Not on file    Gets together: Not on file    Attends religious service: Not on file    Active member of club or organization: Not on file    Attends meetings of clubs or organizations: Not on file    Relationship status: Not on file  . Intimate partner violence:    Fear of current or ex partner: Not on file    Emotionally abused: Not on file    Physically abused: Not on file    Forced sexual activity: Not on file  Other Topics Concern  . 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 SVT - she is refractory to medical therapy. I have discussed the treatment options with the patient. The risks/benefits/goals/expectations of EP study and ablation were reviewed and she wishes to proceed.   [name].D.

## 2018-03-19 NOTE — Progress Notes (Signed)
HPI Lindsey Adams is referred today by [name] for evaluation of SVT. She has had SVT for 20 years. Her episodes have increased in frequency and severity. The longest lasted over 20 hours. She has had 12 in the past year which lasted several hours or more. She has been on medical therapy for several years but this makes her feel lightheaded and fatigued. She has never passed out but at times will get lightheaded. She has sob but no chest pressure with her SVT. The patient usually lies still and her symptoms will eventually stop. She has rates that are typically 210-220 but occaisionally 240/min.  Allergies  Allergen Reactions  . Sulfa Antibiotics Nausea Only  . Codeine Other (See Comments)  . Hydrocodone Other (See Comments)     Current Outpatient Medications  Medication Sig Dispense Refill  . ALPRAZolam (XANAX) 1 MG tablet Take 1 mg by mouth as needed for anxiety.   5  . cyclobenzaprine (FLEXERIL) 5 MG tablet Take 1-2 tablets (5-10 mg total) by mouth 3 (three) times daily as needed for muscle spasms. 60 tablet 1  . QUEtiapine Fumarate (SEROQUEL PO) Take 1 tablet by mouth at bedtime.    . valACYclovir (VALTREX) 1000 MG tablet Take 1/2 tablet daily for suppression, then 1/2 tablet twice daily for 3 days as needed for outbreaks 30 tablet 12  . atenolol (TENORMIN) 25 MG tablet Take 0.5 tablets (12.5 mg total) by mouth daily. (Patient not taking: Reported on 03/19/2018) 90 tablet 1   No current facility-administered medications for this visit.      Past Medical History:  Diagnosis Date  . Allergy   . Anemia   . Anxiety   . Dysmenorrhea   . Fibroid   . Heart murmur   . History of mania   . Hormone disorder   . STD (sexually transmitted disease)   . SVT (supraventricular tachycardia) ([place])   . Urinary incontinence     ROS:   All systems reviewed and negative except as noted in the HPI.   Past Surgical History:  Procedure Laterality Date  . APPENDECTOMY    . BREAST  REDUCTION SURGERY Bilateral   . BREAST SURGERY    . CESAREAN SECTION    . COSMETIC SURGERY    . NASAL SINUS SURGERY       Family History  Problem Relation Age of Onset  . Liver disease Mother   . Heart attack Maternal Grandfather   . Heart disease Maternal Grandfather      Social History   Socioeconomic History  . Marital status: Divorced    Spouse name: Not on file  . Number of children: 1  . Years of education: Not on file  . Highest education level: Not on file  Occupational History  . Not on file  Social Needs  . Financial resource strain: Not on file  . Food insecurity:    Worry: Not on file    Inability: Not on file  . Transportation needs:    Medical: Not on file    Non-medical: Not on file  Tobacco Use  . Smoking status: Never Smoker  . Smokeless tobacco: Never Used  Substance and Sexual Activity  . Alcohol use: Yes    Alcohol/week: 4.0 - 6.0 standard drinks    Types: 4 - 6 Standard drinks or equivalent per week  . Drug use: No  . Sexual activity: Yes    Partners: Male    Birth control/protection: Pill  Lifestyle  . Physical activity:    Days per week: Not on file    Minutes per session: Not on file  . Stress: Not on file  Relationships  . Social connections:    Talks on phone: Not on file    Gets together: Not on file    Attends religious service: Not on file    Active member of club or organization: Not on file    Attends meetings of clubs or organizations: Not on file    Relationship status: Not on file  . Intimate partner violence:    Fear of current or ex partner: Not on file    Emotionally abused: Not on file    Physically abused: Not on file    Forced sexual activity: Not on file  Other Topics Concern  . 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 SVT - she is refractory to medical therapy. I have discussed the treatment options with the patient. The risks/benefits/goals/expectations of EP study and ablation were reviewed and she wishes to proceed.   [name].D.

## 2018-03-23 ENCOUNTER — Other Ambulatory Visit: Payer: Self-pay | Admitting: Family

## 2018-03-23 DIAGNOSIS — D500 Iron deficiency anemia secondary to blood loss (chronic): Secondary | ICD-10-CM

## 2018-03-23 NOTE — Progress Notes (Signed)
GYNECOLOGY  VISIT   HPI: 47 y.o.   Divorced White or Caucasian Not Hispanic or Latino  female   [phone] with No LMP recorded. (Menstrual status: Irregular Periods).   here for consult following PUS. The patient has AUB leading to anemia. She has a h/o SVT, is going to have a cardiac ablation on 04/07/18. He knows she is scheduled for hysterectomy on 04/20/18.  GYNECOLOGIC HISTORY: No LMP recorded. (Menstrual status: Irregular Periods). Contraception:None Menopausal hormone therapy: None        OB History    Gravida  4   Para  1   Term  0   Preterm  1   AB  2   Living  1     SAB  1   TAB  1   Ectopic      Multiple      Live Births  1              Patient Active Problem List   Diagnosis Date Noted  . IDA (iron deficiency anemia) 03/24/2018  . 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 SVT (supraventricular tachycardia) ([place]) 08/08/2014  . ADD (attention deficit disorder) 08/08/2014  . Generalized anxiety disorder 08/08/2014    Past Medical History:  Diagnosis Date  . Allergy   . Anemia   . Anxiety   . Dysmenorrhea   . Fibroid   . Heart murmur   . History of mania   . Hormone disorder   . STD (sexually transmitted disease)   . SVT (supraventricular tachycardia) ([place])   . Urinary incontinence     Past Surgical History:  Procedure Laterality Date  . APPENDECTOMY    . BREAST REDUCTION SURGERY Bilateral   . BREAST SURGERY    . CESAREAN SECTION    . COSMETIC SURGERY    . NASAL SINUS SURGERY      Current Outpatient Medications  Medication Sig Dispense Refill  . ALPRAZolam (XANAX) 1 MG tablet Take 1 mg by mouth as needed for anxiety.   5  . cyclobenzaprine (FLEXERIL) 5 MG tablet Take 1-2 tablets (5-10 mg total) by mouth 3 (three) times daily as needed for muscle spasms. 60 tablet 1  . halobetasol (ULTRAVATE) 0.05 % ointment APPLY TOPICALLY BID TO AA PRN  2  . QUEtiapine Fumarate (SEROQUEL PO) Take 1 tablet by  mouth at bedtime.    . valACYclovir (VALTREX) 1000 MG tablet Take 1/2 tablet daily for suppression, then 1/2 tablet twice daily for 3 days as needed for outbreaks 30 tablet 12  . atenolol (TENORMIN) 25 MG tablet Take 0.5 tablets (12.5 mg total) by mouth daily. (Patient not taking: Reported on 03/19/2018) 90 tablet 1   No current facility-administered medications for this visit.      ALLERGIES: Sulfa antibiotics; Codeine; and Hydrocodone  Family History  Problem Relation Age of Onset  . Liver disease Mother   . Heart attack Maternal Grandfather   . Heart disease Maternal Grandfather     Social History   Socioeconomic History  . Marital status: Divorced    Spouse name: Not on file  . Number of children: 1  . Years of education: Not on file  . Highest education level: Not on file  Occupational History  . Not on file  Social Needs  . Financial resource strain: Not on file  . Food insecurity:    Worry: Not on file    Inability: Not on file  .  Transportation needs:    Medical: Not on file    Non-medical: Not on file  Tobacco Use  . Smoking status: Never Smoker  . Smokeless tobacco: Never Used  Substance and Sexual Activity  . Alcohol use: Yes    Alcohol/week: 4.0 - 6.0 standard drinks    Types: 4 - 6 Standard drinks or equivalent per week  . Drug use: No  . Sexual activity: Yes    Partners: Male    Birth control/protection: None  Lifestyle  . Physical activity:    Days per week: Not on file    Minutes per session: Not on file  . Stress: Not on file  Relationships  . Social connections:    Talks on phone: Not on file    Gets together: Not on file    Attends religious service: Not on file    Active member of club or organization: Not on file    Attends meetings of clubs or organizations: Not on file    Relationship status: Not on file  . Intimate partner violence:    Fear of current or ex partner: Not on file    Emotionally abused: Not on file    Physically abused:  Not on file    Forced sexual activity: Not on file  Other Topics Concern  . 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   HENT: Negative.   Eyes: Negative.   Respiratory: Negative.   Cardiovascular: Negative.   Gastrointestinal: Negative.   Genitourinary: Positive for frequency and urgency.       Night urination Loss of urine with cough/sneeze Cycle changes Painful menses Unscheduled bleeding  Musculoskeletal: Negative.   Skin: Positive for rash.  Neurological: Negative.   Endo/Heme/Allergies: Negative.   Psychiatric/Behavioral: Negative.     PHYSICAL EXAMINATION:    BP 134/84 (BP Location: Left Arm, Patient Position: Sitting, Cuff Size: Normal)   Pulse 86   Wt 146 lb 12.8 oz (66.6 kg)   BMI 23.69 kg/m     General appearance: alert, cooperative and appears stated age  Ultrasound images reviewed with the patient. She has a fibroid uterus with likely adenomyosis as well.    ASSESSMENT AUB Anemia Fibroid uterus SVT, cardiac ablation planned in 2 weeks    PLAN The patient is returning tomorrow for urodynamic testing, symptoms c/w GSI Plan total laparoscopic hysterectomy, bilateral salpingectomy, likely TVT depending on her urodynamic testing Urine for ua, c&s Will need cardiac clearance prior to surgery She has seen the hematologist and is being scheduled for an iron transfusion Will see her back for a pre-op    An After Visit Summary was printed and given to the patient.  ~15 minutes face to face time of which over 50% was spent in counseling.     CC: Laverna Peace, NP, Dr Cristopher [place], [name], [place]

## 2018-03-24 ENCOUNTER — Inpatient Hospital Stay: Payer: BLUE CROSS/BLUE SHIELD | Attending: Family | Admitting: Family

## 2018-03-24 ENCOUNTER — Encounter: Payer: Self-pay | Admitting: Obstetrics and Gynecology

## 2018-03-24 ENCOUNTER — Inpatient Hospital Stay: Payer: BLUE CROSS/BLUE SHIELD

## 2018-03-24 ENCOUNTER — Ambulatory Visit (INDEPENDENT_AMBULATORY_CARE_PROVIDER_SITE_OTHER): Payer: BLUE CROSS/BLUE SHIELD | Admitting: Obstetrics and Gynecology

## 2018-03-24 ENCOUNTER — Other Ambulatory Visit: Payer: Self-pay

## 2018-03-24 ENCOUNTER — Ambulatory Visit (INDEPENDENT_AMBULATORY_CARE_PROVIDER_SITE_OTHER): Payer: BLUE CROSS/BLUE SHIELD

## 2018-03-24 ENCOUNTER — Encounter: Payer: Self-pay | Admitting: Family

## 2018-03-24 VITALS — BP 134/84 | HR 88 | Wt 146.8 lb

## 2018-03-24 DIAGNOSIS — K922 Gastrointestinal hemorrhage, unspecified: Secondary | ICD-10-CM | POA: Diagnosis not present

## 2018-03-24 DIAGNOSIS — I471 Supraventricular tachycardia: Secondary | ICD-10-CM

## 2018-03-24 DIAGNOSIS — N921 Excessive and frequent menstruation with irregular cycle: Secondary | ICD-10-CM

## 2018-03-24 DIAGNOSIS — D259 Leiomyoma of uterus, unspecified: Secondary | ICD-10-CM | POA: Diagnosis not present

## 2018-03-24 DIAGNOSIS — Z862 Personal history of diseases of the blood and blood-forming organs and certain disorders involving the immune mechanism: Secondary | ICD-10-CM

## 2018-03-24 DIAGNOSIS — D509 Iron deficiency anemia, unspecified: Secondary | ICD-10-CM | POA: Insufficient documentation

## 2018-03-24 DIAGNOSIS — N393 Stress incontinence (female) (male): Secondary | ICD-10-CM

## 2018-03-24 DIAGNOSIS — N939 Abnormal uterine and vaginal bleeding, unspecified: Secondary | ICD-10-CM

## 2018-03-24 DIAGNOSIS — D500 Iron deficiency anemia secondary to blood loss (chronic): Secondary | ICD-10-CM | POA: Diagnosis not present

## 2018-03-24 LAB — CBC WITH DIFFERENTIAL (CANCER CENTER ONLY)
Abs Immature Granulocytes: 0.02 10*3/uL (ref 0.00–0.07)
Basophils Absolute: 0.1 10*3/uL (ref 0.0–0.1)
Basophils Relative: 1 %
Eosinophils Absolute: 0.5 10*3/uL (ref 0.0–0.5)
Eosinophils Relative: 9 %
HCT: 32.2 % — ABNORMAL LOW (ref 36.0–46.0)
HEMOGLOBIN: 9.5 g/dL — AB (ref 12.0–15.0)
IMMATURE GRANULOCYTES: 0 %
LYMPHS PCT: 34 %
Lymphs Abs: 2 10*3/uL (ref 0.7–4.0)
MCH: 26.5 pg (ref 26.0–34.0)
MCHC: 29.5 g/dL — ABNORMAL LOW (ref 30.0–36.0)
MCV: 89.9 fL (ref 80.0–100.0)
MONO ABS: 0.8 10*3/uL (ref 0.1–1.0)
MONOS PCT: 13 %
NEUTROS ABS: 2.5 10*3/uL (ref 1.7–7.7)
NEUTROS PCT: 43 %
Platelet Count: 383 10*3/uL (ref 150–400)
RBC: 3.58 MIL/uL — AB (ref 3.87–5.11)
RDW: 17.4 % — ABNORMAL HIGH (ref 11.5–15.5)
WBC Count: 5.9 10*3/uL (ref 4.0–10.5)
nRBC: 0 % (ref 0.0–0.2)

## 2018-03-24 LAB — IRON AND TIBC
Iron: 20 ug/dL — ABNORMAL LOW (ref 41–142)
SATURATION RATIOS: 4 % — AB (ref 21–57)
TIBC: 510 ug/dL — AB (ref 236–444)
UIBC: 490 ug/dL — AB (ref 120–384)

## 2018-03-24 LAB — RETICULOCYTES
Immature Retic Fract: 14.2 % (ref 2.3–15.9)
RBC.: 3.58 MIL/uL — ABNORMAL LOW (ref 3.87–5.11)
Retic Count, Absolute: 48.3 10*3/uL (ref 19.0–186.0)
Retic Ct Pct: 1.4 % (ref 0.4–3.1)

## 2018-03-24 LAB — FERRITIN: Ferritin: <4 ng/mL — ABNORMAL LOW (ref 11–307)

## 2018-03-24 LAB — SAVE SMEAR (SSMR)

## 2018-03-24 NOTE — Progress Notes (Signed)
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 She has been on her cycle now she states for one year. She describes this as heavy with clots. Hgb today is 9.5 with an MCV of 89.  She has failed oral iron which was not effective. Ferritin is 7.  She is symptomatic with fatigue, chills, SOB with over exertion, occasional palpitations, numbness and tingling in hands and feet as well as puffiness in her feet that comes and goes.  She is scheduled for hysterectomy on December 9th.  She has a long history of SVT and recently had an episode prior to [place] with gynecology. She has seen cardiology and will be having an ablation on November 26th. Her Transvaginal US has been rescheduled for later today.  Her mother had history of heavy cycle and had a hysterectomy in her early 46's. She also had history of liver disease requiring liver transplant.  Her paternal grandmother had metastatic cancer with unknown primary.  No personal history of cancer.  No fever, n/v, cough, dizziness, chest pain, abdominal pain or changes in bowel or bladder habits.  She has a generalized rash that has been biopsied by dermatology and they are still unsure of the cause. She is followed by derm at [facility].  No swelling or tenderness in her extremities at this time.  She is due for her mammogram and states she will schedule this herself.  She has maintained a good appetite and is staying well hydrated. Her weight is stable.  She does not smoke but does drink socially.  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   ROS: All other 10 point  review of systems is negative.   PAST MEDICAL HISTORY:   Past Medical History:  Diagnosis Date  . Allergy   . Anemia   . Anxiety   . Dysmenorrhea   . Fibroid   . Heart murmur   . History of mania   . Hormone disorder   . STD (sexually transmitted disease)   . SVT (supraventricular tachycardia) ([place])   . Urinary incontinence     ALLERGIES: Allergies  Allergen Reactions  . Sulfa Antibiotics Nausea Only  . Codeine Other (See Comments)  . Hydrocodone Other (See Comments)      MEDICATIONS:  Current Outpatient Medications on File Prior to Visit  Medication Sig Dispense Refill  . ALPRAZolam (XANAX) 1 MG tablet Take 1 mg by mouth as needed for anxiety.   5  . atenolol (TENORMIN) 25 MG tablet Take 0.5 tablets (12.5 mg total) by mouth daily. (Patient not taking: Reported on 03/19/2018) 90 tablet 1  . cyclobenzaprine (FLEXERIL) 5 MG tablet Take 1-2 tablets (5-10 mg total) by mouth 3 (three) times daily as needed for muscle spasms. 60 tablet 1  . halobetasol (ULTRAVATE) 0.05 % ointment APPLY TOPICALLY BID TO AA PRN  2  . QUEtiapine Fumarate (SEROQUEL PO) Take 1 tablet by mouth at bedtime.    . valACYclovir (VALTREX) 1000 MG tablet Take 1/2 tablet daily for suppression, then 1/2 tablet twice daily for 3 days as needed for outbreaks 30 tablet 12  No current facility-administered medications on file prior to visit.      PAST SURGICAL HISTORY Past Surgical History:  Procedure Laterality Date  . APPENDECTOMY    . BREAST REDUCTION SURGERY Bilateral   . BREAST SURGERY    . CESAREAN SECTION    . COSMETIC SURGERY    . NASAL SINUS SURGERY      FAMILY HISTORY: Family History  Problem Relation Age of Onset  . Liver disease Mother   . Heart attack Maternal Grandfather   . Heart disease Maternal Grandfather     SOCIAL HISTORY:  reports that she has never smoked. She has never used smokeless tobacco. She reports that she drinks about 4.0 - 6.0 standard drinks of alcohol per week. She  reports that she does not use drugs.  PERFORMANCE STATUS: The patient's performance status is 1 - Symptomatic but completely ambulatory  PHYSICAL EXAM: Most Recent Vital Signs: Blood pressure 134/86, pulse (!) 109, temperature 97.9 F (36.6 C), temperature source Oral, resp. rate 20, weight 145 lb 12.8 oz (66.1 kg), SpO2 100 %. BP 134/86 (BP Location: Left Arm, Patient Position: Sitting)   Pulse (!) 109   Temp 97.9 F (36.6 C) (Oral)   Resp 20   Wt 145 lb 12.8 oz (66.1 kg)   SpO2 100%   BMI 23.53 kg/m   General Appearance:    Alert, cooperative, no distress, appears stated age  Head:    Normocephalic, without obvious abnormality, atraumatic  Eyes:    PERRL, conjunctiva/corneas clear, EOM's intact, fundi    benign, both eyes        Throat:   Lips, mucosa, and tongue normal; teeth and gums normal  Neck:   Supple, symmetrical, trachea midline, no adenopathy;    thyroid:  no enlargement/tenderness/nodules; no carotid   bruit or JVD  Back:     Symmetric, no curvature, ROM normal, no CVA tenderness  Lungs:     Clear to auscultation bilaterally, respirations unlabored  Chest Wall:    No tenderness or deformity   Heart:    Regular rate and rhythm, S1 and S2 normal, no murmur, rub   or gallop     Abdomen:     Soft, non-tender, bowel sounds active all four quadrants,    no masses, no organomegaly        Extremities:   Extremities normal, atraumatic, no cyanosis or edema  Pulses:   2+ and symmetric all extremities  Skin:   Skin color, texture, turgor normal, no rashes or lesions  Lymph nodes:   Cervical, supraclavicular, and axillary nodes normal  Neurologic:   CNII-XII intact, normal strength, sensation and reflexes    throughout    LABORATORY DATA:  Results for orders placed or performed in visit on 03/24/18 (from the past 48 hour(s))  CBC with Differential ([place] Only)     Status: Abnormal   Collection Time: 03/24/18  8:06 AM  Result Value Ref Range   WBC Count 5.9  4.0 - 10.5 K/uL   RBC 3.58 (L) 3.87 - 5.11 MIL/uL   Hemoglobin 9.5 (L) 12.0 - 15.0 g/dL   HCT 32.2 (L) 36.0 - 46.0 %   MCV 89.9 80.0 - 100.0 fL   MCH 26.5 26.0 - 34.0 pg   MCHC 29.5 (L) 30.0 - 36.0 g/dL   RDW 17.4 (H) 11.5 - 15.5 %   Platelet Count 383 150 - 400 K/uL   nRBC 0.0 0.0 - 0.2 %   Neutrophils Relative % 43 %  Neutro Abs 2.5 1.7 - 7.7 K/uL   Lymphocytes Relative 34 %   Lymphs Abs 2.0 0.7 - 4.0 K/uL   Monocytes Relative 13 %   Monocytes Absolute 0.8 0.1 - 1.0 K/uL   Eosinophils Relative 9 %   Eosinophils Absolute 0.5 0.0 - 0.5 K/uL   Basophils Relative 1 %   Basophils Absolute 0.1 0.0 - 0.1 K/uL   Immature Granulocytes 0 %   Abs Immature Granulocytes 0.02 0.00 - 0.07 K/uL    Comment: Performed at [facility] at [facility], [address] [place], [place] [address]  Save Smear [facility])     Status: None   Collection Time: 03/24/18  8:06 AM  Result Value Ref Range   Smear Review SMEAR STAINED AND AVAILABLE FOR REVIEW     Comment: Performed at [facility] at [facility], [address], [place], [place] [address]  Reticulocytes     Status: Abnormal   Collection Time: 03/24/18  8:06 AM  Result Value Ref Range   Retic Ct Pct 1.4 0.4 - 3.1 %   RBC. 3.58 (L) 3.87 - 5.11 MIL/uL   Retic Count, Absolute 48.3 19.0 - 186.0 K/uL   Immature Retic Fract 14.2 2.3 - 15.9 %    Comment: Performed at [facility] at [facility], [address] [place], [place] [address]  Iron and TIBC     Status: Abnormal   Collection Time: 03/24/18  8:07 AM  Result Value Ref Range   Iron 20 (L) 41 - 142 ug/dL   TIBC 510 (H) 236 - 444 ug/dL   Saturation Ratios 4 (L) 21 - 57 %   UIBC 490 (H) 120 - 384 ug/dL    Comment: Performed at [facility], [place] [address], [place], [place] [address]  Ferritin     Status: Abnormal   Collection Time: 03/24/18  8:07 AM  Result Value Ref Range   Ferritin <4  (L) 11 - 307 ng/mL    Comment: Performed at [facility], [place] [address] [place], [place] [address]      RADIOGRAPHY: No results found.     PATHOLOGY: None  ASSESSMENT/PLAN: Ms. Strang is a very pleasant 47 yo caucasian female with history of iron deficiency secondary to heavy cycles. She is symptomatic as mentioned above.  We will schedule her for a dose of IV iron this week and a second dose next week so she will be ready for both her upcoming procedures.  We will plan to see her back in another month for follow-up.   All questions were answered and she is in agreement with the plan. She will contact our office with any questions or concerns. We can certainly see her sooner if need be.   She was discussed with and also seen by Dr. Marin Olp and he is in agreement with the aforementioned.   Laverna Peace     Addendum:  I saw and examined the patient with Judson Roch.  Ms. Poblano is very nice.  I looked at her blood under the microscope.  She had some anisocytosis and poikilocytosis.  There were no nucleated red blood cells.  She had some microcytic red blood cells.  I saw a couple teardrop cells.  She had no inclusion bodies.  White blood cells appear normal in morphology maturation.  I do not see any hypersegmented polys.  Platelets were adequate  number and size.  Her iron studies clearly show profound iron deficiency.  Her ferritin is less than 4.  Her iron saturation of 4%.  We will go ahead and give her IV iron.  She will definitely need to doses.  This should help get her iron levels up and get her hemoglobin back up so she can have her procedures.  I cannot think of any other issue that she has outside of iron deficiency.  There is nothing on her physical exam that would suggest an underlying myelodysplastic process or a myeloproliferative process.  We spent about 45 minutes with Ms. Magwood.  We answered all of her questions.  We reviewed her lab work  with her.  We will get the iron set up.  She will definitely need 2 doses.  We will get her back to see [place] in another month or so.  At that point, her hemoglobin should be up nicely.  Lattie Haw, MD

## 2018-03-24 NOTE — Addendum Note (Signed)
Addended by: Gwendlyn Deutscher on: 03/24/2018 05:01 PM   Modules accepted: Orders

## 2018-03-25 ENCOUNTER — Emergency Department (HOSPITAL_COMMUNITY)
Admission: EM | Admit: 2018-03-25 | Discharge: 2018-03-25 | Disposition: A | Discharge status: Home / Self Care | Payer: BLUE CROSS/BLUE SHIELD | Attending: Emergency Medicine | Admitting: Emergency Medicine

## 2018-03-25 ENCOUNTER — Ambulatory Visit (INDEPENDENT_AMBULATORY_CARE_PROVIDER_SITE_OTHER): Payer: BLUE CROSS/BLUE SHIELD | Admitting: Obstetrics and Gynecology

## 2018-03-25 VITALS — BP 146/82 | HR 220 | Resp 16 | Ht 66.0 in | Wt 149.0 lb

## 2018-03-25 DIAGNOSIS — N393 Stress incontinence (female) (male): Secondary | ICD-10-CM

## 2018-03-25 DIAGNOSIS — I471 Supraventricular tachycardia: Secondary | ICD-10-CM | POA: Diagnosis not present

## 2018-03-25 DIAGNOSIS — D500 Iron deficiency anemia secondary to blood loss (chronic): Secondary | ICD-10-CM | POA: Insufficient documentation

## 2018-03-25 DIAGNOSIS — I10 Essential (primary) hypertension: Secondary | ICD-10-CM | POA: Diagnosis not present

## 2018-03-25 DIAGNOSIS — R000 Tachycardia, unspecified: Secondary | ICD-10-CM | POA: Diagnosis not present

## 2018-03-25 DIAGNOSIS — Z79899 Other long term (current) drug therapy: Secondary | ICD-10-CM | POA: Diagnosis not present

## 2018-03-25 LAB — BASIC METABOLIC PANEL
Anion gap: 11 (ref 5–15)
BUN: 10 mg/dL (ref 6–20)
CALCIUM: 8.6 mg/dL — AB (ref 8.9–10.3)
CO2: 20 mmol/L — ABNORMAL LOW (ref 22–32)
Chloride: 107 mmol/L (ref 98–111)
Creatinine, Ser: 0.68 mg/dL (ref 0.44–1.00)
GFR calc Af Amer: >60 mL/min (ref >60)
GLUCOSE: 85 mg/dL (ref 70–99)
POTASSIUM: 3.3 mmol/L — AB (ref 3.5–5.1)
Sodium: 138 mmol/L (ref 135–145)

## 2018-03-25 LAB — I-STAT CHEM 8, ED
BUN: 9 mg/dL (ref 6–20)
CHLORIDE: 104 mmol/L (ref 98–111)
Calcium, Ion: 1.12 mmol/L — ABNORMAL LOW (ref 1.15–1.40)
Creatinine, Ser: 0.7 mg/dL (ref 0.44–1.00)
GLUCOSE: 88 mg/dL (ref 70–99)
HCT: 28 % — ABNORMAL LOW (ref 36.0–46.0)
Hemoglobin: 9.5 g/dL — ABNORMAL LOW (ref 12.0–15.0)
Potassium: 3.5 mmol/L (ref 3.5–5.1)
Sodium: 140 mmol/L (ref 135–145)
TCO2: 24 mmol/L (ref 22–32)

## 2018-03-25 LAB — CBC WITH DIFFERENTIAL/PLATELET
Abs Immature Granulocytes: 0.01 10*3/uL (ref 0.00–0.07)
BASOS PCT: 1 %
Basophils Absolute: 0.1 10*3/uL (ref 0.0–0.1)
EOS ABS: 0.2 10*3/uL (ref 0.0–0.5)
Eosinophils Relative: 4 %
HCT: 29.3 % — ABNORMAL LOW (ref 36.0–46.0)
Hemoglobin: 8.8 g/dL — ABNORMAL LOW (ref 12.0–15.0)
Immature Granulocytes: 0 %
Lymphocytes Relative: 32 %
Lymphs Abs: 1.7 10*3/uL (ref 0.7–4.0)
MCH: 26.3 pg (ref 26.0–34.0)
MCHC: 30 g/dL (ref 30.0–36.0)
MCV: 87.7 fL (ref 80.0–100.0)
MONO ABS: 0.6 10*3/uL (ref 0.1–1.0)
MONOS PCT: 12 %
Neutro Abs: 2.7 10*3/uL (ref 1.7–7.7)
Neutrophils Relative %: 51 %
PLATELETS: 384 10*3/uL (ref 150–400)
RBC: 3.34 MIL/uL — ABNORMAL LOW (ref 3.87–5.11)
RDW: 17.5 % — AB (ref 11.5–15.5)
WBC: 5.3 10*3/uL (ref 4.0–10.5)
nRBC: 0 % (ref 0.0–0.2)

## 2018-03-25 NOTE — Patient Instructions (Signed)
After your procedure:   You may have a mild bladder and rectal discomfort for a few hours after the test. . You may experience some frequent urination and slight burning the first few times you urinate after the test. Rarely, the urine may be blood tinged. These are both due to catheter placements and resolve quickly.  . You should call our office immediately if you have signs of infection, which may include bladder pain, urinary urgency, fever, or burning during urination. .  We do encourage you to drink plenty of water after completion of the test today.   Please call our office with any concerns or questions.   [facility] [address], [place], [place] [address] [facility]:  [phone]; Fax:  [phone]

## 2018-03-25 NOTE — Progress Notes (Signed)
Lindsey Adams is a 47 y.o. female Presented today for urodynamics bladder testing ordered by Dr. Talbert Nan.   Allergies and medications reviewed.  Denies complaints today. No urinary complaints.   Patient reports urinary leakage with coughing, sneezing, exercise, sometimes intercourse.   Urodynamics testing initiated. Lumax Bladder Catheter #10 [place] inserted via sterile techique by Lamont Snowball, RN. Catheter secured with tape and patient tolerated procedure well. Reading magazine and talking with RN's during procedure.   Post void residual 120 ml.   1530 Patient asked this RN if I had a stethoscope and could listen to her heart. Apical pulse auscultated, Greater than 220 BPM.  Urodynamics Procedure aborted.   Patient becoming pale and hands felt cold. Dr. Talbert Nan called to bedside and 911 called for transport. Pt denies CP or SOB. Hx of SVT, scheduled cardiac ablation 02/05/18. Attempted to have patient valsalva without decrease in heart rate.   Patient remained alert and oriented x 4 and was talking and laughing while awaiting transport. Ambulance came to transport patient at approximately 1540. Ambulatory to stretcher. Remained in SVT. Paramedics attempted to have patient valsalva as well with slight decrease in HR.   Pt was to be [facility] to [facility].    Addendum: I was called into the room, the patient was in SVT, happened after starting the procedure. The patient denied SOB or chest pain. Heart rate in the low 200's. She was alert, talking, no distress.  Procedure was stopped, ambulance was called and the patient was transferred to the hospital.

## 2018-03-25 NOTE — ED Triage Notes (Signed)
Pt reports she was in a procedure with OBGYN when she laid back for procedure and went into SVT rate 240.

## 2018-03-25 NOTE — Discharge Instructions (Addendum)
It is important you call Dr. Tanna Furry office tomorrow to discuss the possibility of moving the ablation procedure earlier. In the meantime, make sure you are staying well-hydrated with water. Return to the emergency room if you develop chest pain, difficulty breathing, intractable SVT, or any new or concerning symptoms.

## 2018-03-25 NOTE — Progress Notes (Signed)
Agree with procedure note. Urodynamic testing aborted due to patient reported onset of SVT after insertion of bladder catheter. Insertion of rectal catheter was explained to patient and ready for insertion when patient requested to know if stethoscope was available. Patient reported she could feel heart rate increasing but denied chest pain or SOB. After heart rate confirmed by Karen Chafe, RN to be above 220 with no response to valsalva, procedure aborted, bladder catheter removed. Dr Talbert Nan in to see patient while awaiting EMS. Transferred to [facility]. Patient is scheduled for cardiac ablation on 04-07-18.

## 2018-03-25 NOTE — ED Notes (Signed)
Patient verbalizes understanding of discharge instructions. Opportunity for questioning and answers were provided. Armband removed by staff, pt discharged from ED.  

## 2018-03-25 NOTE — ED Provider Notes (Signed)
[facility] EMERGENCY DEPARTMENT Provider Note   CSN: [id number] Arrival date & time: 03/25/18  1626     History   Chief Complaint Chief Complaint  Patient presents with  . SVT    HPI Lindsey Adams is a 47 y.o. female presenting for SVT.  Patient states she has a history of SVT.  While she was at her OB/GYN's office today she had an episode of SVT during which her heart rate went up to 140.  EMS was called, she was given 6 of adenosine.  Since then, her heart rate has improved.  She denies chest pain, shortness of breath, dizziness, or lightheadedness.  She states she is not currently on anything for SVT, as she has an ablation planned with Dr. Lovena Le from cardiology on the 25th of this month.  Patient states the working Link Snuffer is that she is going into SVT due to chronic anemia from bleeding from her thought fibroids.  However, she is unable to get a hysterectomy until SVT is fixed.  She has no other medical problems, takes no medications daily.  She denies recent fevers, chills, sore throat, cough, nausea, vomiting, abdominal pain, urinary symptoms, abnormal bowel movements.  Additional history obtained from chart review, patient seen last week for the same.  She followed up with cardiology, and that is when the ablation was scheduled.  HPI  Past Medical History:  Diagnosis Date  . Allergy   . Anemia   . Anxiety   . Dysmenorrhea   . Fibroid   . Heart murmur   . History of mania   . Hormone disorder   . STD (sexually transmitted disease)   . SVT (supraventricular tachycardia) ([place])   . Urinary incontinence     Patient Active Problem List   Diagnosis Date Noted  . IDA (iron deficiency anemia) 03/24/2018  . 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 SVT (supraventricular tachycardia) ([place]) 08/08/2014  . ADD (attention deficit disorder) 08/08/2014  . Generalized anxiety disorder 08/08/2014    Past Surgical History:   Procedure Laterality Date  . APPENDECTOMY    . BREAST REDUCTION SURGERY Bilateral   . BREAST SURGERY    . CESAREAN SECTION    . COSMETIC SURGERY    . NASAL SINUS SURGERY       OB History    Gravida  4   Para  1   Term  0   Preterm  1   AB  2   Living  1     SAB  1   TAB  1   Ectopic      Multiple      Live Births  1            Home Medications    Prior to Admission medications   Medication Sig Start Date End Date Taking? Authorizing Provider  ALPRAZolam Duanne Moron) 1 MG tablet Take 1 mg by mouth as needed for anxiety.  10/11/17   [provider]  atenolol (TENORMIN) 25 MG tablet Take 0.5 tablets (12.5 mg total) by mouth daily. Patient not taking: Reported on 03/19/2018 03/17/18   Fransico Meadow, PA-C  cyclobenzaprine (FLEXERIL) 5 MG tablet Take 1-2 tablets (5-10 mg total) by mouth 3 (three) times daily as needed for muscle spasms. 06/10/17   Weber, Damaris Hippo, PA-C  halobetasol (ULTRAVATE) 0.05 % ointment APPLY TOPICALLY BID TO AA PRN 03/11/18   [provider]  QUEtiapine Fumarate (SEROQUEL PO)  Take 1 tablet by mouth at bedtime.    [provider]  valACYclovir (VALTREX) 1000 MG tablet Take 1/2 tablet daily for suppression, then 1/2 tablet twice daily for 3 days as needed for outbreaks 06/10/17   Gale Journey, Damaris Hippo, PA-C    Family History Family History  Problem Relation Age of Onset  . Liver disease Mother   . Heart attack Maternal Grandfather   . Heart disease Maternal Grandfather     Social History Social History   Tobacco Use  . Smoking status: Never Smoker  . Smokeless tobacco: Never Used  Substance Use Topics  . Alcohol use: Yes    Alcohol/week: 4.0 - 6.0 standard drinks    Types: 4 - 6 Standard drinks or equivalent per week  . Drug use: No     Allergies   Sulfa antibiotics; Codeine; and Hydrocodone   Review of Systems Review of Systems  Respiratory: Negative for cough and shortness of breath.   Cardiovascular:  Positive for palpitations (resolved). Negative for chest pain.  All other systems reviewed and are negative.    Physical Exam Updated Vital Signs BP 135/85   Pulse 98   Temp 98.2 F (36.8 C) (Oral)   Resp 17   SpO2 98%   Physical Exam  Constitutional: She is oriented to person, place, and time. She appears well-developed and well-nourished. No distress.  Sitting comfortably in the bed in no acute distress  HENT:  Head: Normocephalic and atraumatic.  MM moist, no pallor.  Eyes: Pupils are equal, round, and reactive to light. Conjunctivae and EOM are normal.  Neck: Normal range of motion. Neck supple.  Cardiovascular: Regular rhythm and intact distal pulses.  Mildly tachycardic around 110.  Pulmonary/Chest: Effort normal and breath sounds normal. No respiratory distress. She has no wheezes.  Speaking in full sentences.  Clear lung sounds in all fields.  Abdominal: Soft. She exhibits no distension and no mass. There is no tenderness. There is no guarding.  Musculoskeletal: Normal range of motion.  Neurological: She is alert and oriented to person, place, and time.  Skin: Skin is warm and dry. Capillary refill takes less than 2 seconds.  Psychiatric: She has a normal mood and affect.  Nursing note and vitals reviewed.    ED Treatments / Results  Labs (all labs ordered are listed, but only abnormal results are displayed) Labs Reviewed  CBC WITH DIFFERENTIAL/PLATELET - Abnormal; Notable for the following components:      Result Value   RBC 3.34 (*)    Hemoglobin 8.8 (*)    HCT 29.3 (*)    RDW 17.5 (*)    All other components within normal limits  BASIC METABOLIC PANEL - Abnormal; Notable for the following components:   Potassium 3.3 (*)    CO2 20 (*)    Calcium 8.6 (*)    All other components within normal limits  I-STAT CHEM 8, ED - Abnormal; Notable for the following components:   Calcium, Ion 1.12 (*)    Hemoglobin 9.5 (*)    HCT 28.0 (*)    All other components  within normal limits    EKG None  Radiology US Transvaginal Non-ob  Result Date: 03/24/2018 SEE PROGRESS NOTES FOR RESULTS    Procedures Procedures (including critical care time)  Medications Ordered in ED Medications - No data to display   Initial Impression / Assessment and Plan / ED Course  I have reviewed the triage vital signs and the nursing notes.  Pertinent labs &  imaging results that were available during my care of the patient were reviewed by me and considered in my medical decision making (see chart for details).     Patient presenting for evaluation of SVT episode while at the OB/GYN's office today.  Physical exam reassuring, heart rate has improved.  She is no longer in SVT.  Patient without chest pain or shortness of breath.  History of the same.  Has ablation planned later this month.  Will obtain recent labs to check hemoglobin, fluids for tachycardia, and reassess.  Hemoglobin stable at 8.8. Otherwise labs reassuing.  EKG was sinus tach, no stemi. Heart rate improved with fluids.  On recheck, patient remains chest pain-free and without shortness of breath. discussed with cardiology, who recommends that she call the office tomorrow to see if the ablation can be moved up.  There is no need for an emergent ablation tonight.  Discussed with patient.  At this time, patient appears safe for discharge.  Return precautions given.  Patient states she understands and agrees plan.    Final Clinical Impressions(s) / ED Diagnoses   Final diagnoses:  SVT (supraventricular tachycardia) ([place])  Iron deficiency anemia due to chronic blood loss    ED Discharge Orders    None       Franchot Heidelberg, PA-C 03/25/18 [place], [place], DO 03/25/18 2307

## 2018-03-26 ENCOUNTER — Telehealth: Payer: Self-pay | Admitting: Obstetrics and Gynecology

## 2018-03-26 ENCOUNTER — Encounter: Payer: Self-pay | Admitting: Obstetrics and Gynecology

## 2018-03-26 ENCOUNTER — Telehealth: Payer: Self-pay | Admitting: Internal Medicine

## 2018-03-26 DIAGNOSIS — F3174 Bipolar disorder, in full remission, most recent episode manic: Secondary | ICD-10-CM | POA: Diagnosis not present

## 2018-03-26 DIAGNOSIS — F3176 Bipolar disorder, in full remission, most recent episode depressed: Secondary | ICD-10-CM | POA: Diagnosis not present

## 2018-03-26 MED ORDER — MEDROXYPROGESTERONE ACETATE 10 MG PO TABS: ORAL_TABLET | ORAL | 0 refills | Status: DC

## 2018-03-26 NOTE — Telephone Encounter (Signed)
Pt is calling in, as instructed by her Gynecologist, to speak with Dr Lovena Le and Sonia Baller RN, about moving her scheduled SVT ablation to a much sooner date.  Pt states she went to her Gynecologist yesterday, for work-up of getting her hysterectomy done.  Pt states while at that visit, she went back into SVT with a rate 185-200 bpm. Pt states she did convert back to a NSR with normal rate, after she went to the ER after that.  Pt states she did go to the ER yesterday, via EMS for this, and was advised to reach out to Dr Lovena Le and RN, to see if moving her ablation to a sooner date could be done.  Pt states that her OBGYN is very fearful to address her bleeding issue and doing a hysterectomy on her, until her heart issue is under control.  Pt states she is currently at home resting comfortably.  Pt is aware that both Dr Lovena Le and Sonia Baller RN are out of the office today.  Pt states that's fine, but she would like for Sonia Baller RN to call her first thing in the morning, to see if this request can be expedited.  Pt also inquired that her OBGYN Dr. Talbert Nan, was going to reach out to Dr Lovena Le herself, to endorse that the pt went into SVT during the office visit.  Pt also states that Dr Talbert Nan was going to ask Dr Lovena Le if he would consider moving her procedure up as well. Informed the pt that I will route this message to both Dr Lovena Le and Sonia Baller for further review, and follow-up with the pt, upon return to the office.  Advised the pt to take it easy, take all meds prescribed, hydrate well, and call us back if she goes back into SVT.  Pt verbalized understanding and agrees with this plan.

## 2018-03-26 NOTE — Telephone Encounter (Signed)
Patient calling to speak with Gay Filler.

## 2018-03-26 NOTE — Telephone Encounter (Signed)
° °  Patient states she has been to the ED twice recently for elevated HR. Patient would like to reschedule her 11/26 procedure to a sooner date. Please call

## 2018-03-26 NOTE — Telephone Encounter (Signed)
Return call to patient. States was discharge from hospital. Still scheduled for cardiac ablation on 04-07-18. Reports bleeding increased this am. Now bleeding through ultra tampon every hour. Bleeding is heavy enough to push out tampon. 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  Denies palpitations, dizziness, SOB. States "dont feel any different" but ready to get this done.  Request we call cardiology and request ablation be moved up so hysterectomy can be moved up.  Advised will review with DR Talbert Nan and call back.

## 2018-03-26 NOTE — Telephone Encounter (Signed)
Reviewed with Dr Talbert Nan. Orders received. Patient advised Dr Talbert Nan recommends go home from work. Provera 10 mg BID, begin tonight.  If bleeding increases or patient develops dizziness, palpitations, SOB, go to [facility] for GYN issue versus [facility] for SVT.  Stressed to call back in am ( has iron infusion scheduled) for update on bleeding.  Patient agreeable to plan.   Routing to Dr Talbert Nan. Encounter closed.

## 2018-03-26 NOTE — Telephone Encounter (Signed)
Routing to S. Yeakley, RN.  

## 2018-03-27 ENCOUNTER — Telehealth: Payer: Self-pay | Admitting: Obstetrics and Gynecology

## 2018-03-27 ENCOUNTER — Inpatient Hospital Stay: Payer: BLUE CROSS/BLUE SHIELD

## 2018-03-27 VITALS — BP 131/83 | HR 88 | Temp 98.4°F | Resp 16

## 2018-03-27 DIAGNOSIS — D500 Iron deficiency anemia secondary to blood loss (chronic): Secondary | ICD-10-CM

## 2018-03-27 DIAGNOSIS — K922 Gastrointestinal hemorrhage, unspecified: Secondary | ICD-10-CM | POA: Diagnosis not present

## 2018-03-27 MED ORDER — SODIUM CHLORIDE 0.9 % IV SOLN
Freq: Once | INTRAVENOUS | Status: AC
  Administered 2018-03-27: 15:00:00 via INTRAVENOUS
  Filled 2018-03-27: qty 250

## 2018-03-27 MED ORDER — SODIUM CHLORIDE 0.9 % IV SOLN
510.0000 mg | Freq: Once | INTRAVENOUS | Status: AC
  Administered 2018-03-27: 510 mg via INTRAVENOUS
  Filled 2018-03-27: qty 17

## 2018-03-27 NOTE — Telephone Encounter (Signed)
1100. Update provided to patient.  Have reviewed her case with anesthesia.  Dr Royce Macadamia recommends waiting till after cardiac ablation unless hysterectomy becomes emergent. Dr Royce Macadamia agreeable to hysterectomy day following ablation pending availability.     Dr Talbert Nan aware and agreeable.  Patient advised of options to proceed with currently scheduled date on 04-20-18 and complete evaluation of SUI following ablation. Alternative is that can try to move surgery soon ( but after ablation) and omit SUI work-up.  Patient requests first available surgery date.

## 2018-03-27 NOTE — Telephone Encounter (Signed)
Returned call to Pt.  Advised first available for procedure is April 06, 2018.  Discussed with Dr. Lovena Le.  Dr. Lovena Le in agreement.  Per Dr. Clotilde Dieter does not keep Pt from having hysterectomy.  Notified Pt.  Per Pt GYN will not do procedure until after ablation.  Pt moved to April 06, 2018 per request.

## 2018-03-27 NOTE — Telephone Encounter (Signed)
Call to patient. States she has talked with DR Taylor's office. They are unable to move up cardiac ablation. She was told hysterectomy could be performed day following ablation.  Advised would review options with Dr Talbert Nan and anesthesia and call back.

## 2018-03-27 NOTE — Patient Instructions (Signed)

## 2018-03-27 NOTE — Telephone Encounter (Signed)
Patient returning call.

## 2018-03-27 NOTE — Telephone Encounter (Signed)
Patient left voicemail to give Gay Filler update from Dr. Tanna Furry office.

## 2018-03-28 LAB — URINE CULTURE

## 2018-03-28 NOTE — Telephone Encounter (Signed)
agree

## 2018-03-30 ENCOUNTER — Telehealth: Payer: Self-pay

## 2018-03-30 MED ORDER — AMOXICILLIN 875 MG PO TABS: 875.0000 mg | ORAL_TABLET | Freq: Two times a day (BID) | ORAL | 0 refills | Status: DC

## 2018-03-30 NOTE — Telephone Encounter (Signed)
Spoke with patient. Patient states that she would like to take Amoxicillin 875 mg q 12 hours for 5 days instead of returning for cath. Rx sent to pharmacy on file. Patient verbalizes understanding. Encounter closed.

## 2018-03-30 NOTE — Telephone Encounter (Signed)
-----   Message from Salvadore Dom, MD sent at 03/29/2018 12:41 PM EST ----- Please let the patient know that her urine grew our Group B strep. This is a common bacteria found in the vagina and could be a contaminant. The options are to have her return for a st cath ua and send it for culture or empirically treat her. She is going to have surgery soon, so if she prefers the st cath we need to get her in right away. If she prefers treatment, treat with amoxicillin 875 mg q 12 hours for 5 days

## 2018-03-30 NOTE — Telephone Encounter (Signed)
Call to patient. Patient reports bleeding is improved with Provera. Currently changing tampon every 3-4 hours.  Discussed first available option is 04-13-18.  No available OR time at [facility] on 04-07-18.  Patient has option of 12-2 without bladder evaluation or 04-20-18 with bladder evaluation.  Patient will consider and call back.
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
## 2018-03-31 NOTE — Telephone Encounter (Signed)
Patient called back yesterday to proceed with hysterectomy. Declines TVT at this time. Surgery scheduled for 04-07-18 at [facility] Call to patient to notify. Unable to leave message due to full voice mail box.

## 2018-04-01 ENCOUNTER — Telehealth: Payer: Self-pay | Admitting: Internal Medicine

## 2018-04-01 NOTE — Telephone Encounter (Signed)
New Message:    Patient calling she is having some concerns because she has been having STV all week. Please call patient back.

## 2018-04-03 ENCOUNTER — Inpatient Hospital Stay: Payer: BLUE CROSS/BLUE SHIELD

## 2018-04-03 ENCOUNTER — Other Ambulatory Visit: Payer: BLUE CROSS/BLUE SHIELD | Admitting: *Deleted

## 2018-04-03 VITALS — BP 125/78 | HR 92

## 2018-04-03 DIAGNOSIS — D500 Iron deficiency anemia secondary to blood loss (chronic): Secondary | ICD-10-CM | POA: Diagnosis not present

## 2018-04-03 DIAGNOSIS — I471 Supraventricular tachycardia: Secondary | ICD-10-CM

## 2018-04-03 DIAGNOSIS — K922 Gastrointestinal hemorrhage, unspecified: Secondary | ICD-10-CM | POA: Diagnosis not present

## 2018-04-03 DIAGNOSIS — Z01812 Encounter for preprocedural laboratory examination: Secondary | ICD-10-CM

## 2018-04-03 MED ORDER — SODIUM CHLORIDE 0.9 % IV SOLN
Freq: Once | INTRAVENOUS | Status: AC
  Administered 2018-04-03: 15:00:00 via INTRAVENOUS
  Filled 2018-04-03: qty 250

## 2018-04-03 MED ORDER — SODIUM CHLORIDE 0.9 % IV SOLN
510.0000 mg | Freq: Once | INTRAVENOUS | Status: AC
  Administered 2018-04-03: 510 mg via INTRAVENOUS
  Filled 2018-04-03: qty 17

## 2018-04-03 NOTE — Telephone Encounter (Signed)
Confirmed with Pt.  She is NOT currently taking atenolol.  Advised Pt NOT to take any this weekend.

## 2018-04-03 NOTE — Telephone Encounter (Signed)
Patient returned call. Confirmed surgery time of 1030 at [facility] on 04-07-18. Reviewed surgery instructions sheet. Patient will be in hospital post cardiac ablation on 04-06-18 and come to OR from there.  Case was reviewed with Dr Royce Macadamia in anesthesia and approved for [facility] on day following cardiac ablation.   Declines to schedule post op appointment now, will call back after surgery to schedule.   Routing to Dr Talbert Nan. Encounter closed.

## 2018-04-03 NOTE — Patient Instructions (Signed)

## 2018-04-03 NOTE — Telephone Encounter (Signed)
Call placed to Pt.  Advised of new arrival time for SVT ablation- 11:30 am on April 06, 2018  Pt to call gynecologist for directions on April 07, 2018 since Pt will still be hospitalized post ablation.  Will call back this number.  ADVISE PATIENT TO HOLD ATENOLOL

## 2018-04-04 LAB — BASIC METABOLIC PANEL
BUN / CREAT RATIO: 11 (ref 9–23)
BUN: 7 mg/dL (ref 6–24)
CHLORIDE: 101 mmol/L (ref 96–106)
CO2: 22 mmol/L (ref 20–29)
CREATININE: 0.64 mg/dL (ref 0.57–1.00)
Calcium: 9.6 mg/dL (ref 8.7–10.2)
GFR calc non Af Amer: 107 mL/min/{1.73_m2} (ref >59)
GFR, EST AFRICAN AMERICAN: 123 mL/min/{1.73_m2} (ref >59)
Glucose: 91 mg/dL (ref 65–99)
Potassium: 4 mmol/L (ref 3.5–5.2)
Sodium: 140 mmol/L (ref 134–144)

## 2018-04-04 LAB — CBC
HEMATOCRIT: 30.2 % — AB (ref 34.0–46.6)
Hemoglobin: 9.9 g/dL — ABNORMAL LOW (ref 11.1–15.9)
MCH: 27.2 pg (ref 26.6–33.0)
MCHC: 32.8 g/dL (ref 31.5–35.7)
MCV: 83 fL (ref 79–97)
Platelets: 331 10*3/uL (ref 150–450)
RBC: 3.64 x10E6/uL — ABNORMAL LOW (ref 3.77–5.28)
RDW: 18.8 % — AB (ref 12.3–15.4)
WBC: 4.9 10*3/uL (ref 3.4–10.8)

## 2018-04-05 NOTE — Progress Notes (Signed)
GYNECOLOGY  VISIT   HPI: 47 y.o.   Divorced White or Caucasian Not Hispanic or Latino  female   [phone] with No LMP recorded. (Menstrual status: Irregular Periods).   here for a pre-op visit. The patient has a long history of menometrorrhagia on OCP's leading to anemia. Ultrasound with 3 small myomas and likely adenomyosis. Endometrial biopsy was benign. Pap was normal last January. She has gotten iron transfusions. Her last hgb was 9.9 gm/dl last week. She was going to have surgery in December, surgery has been moved up and she has been started on provera for very heavy bleeding.  She has SVT and is having an ablation today, surgery planned for tomorrow.  She stopped her OCP's weeks ago, is on provera to help prevent bleeding.     No tobacco, social ETOH.  GYNECOLOGIC HISTORY: No LMP recorded. (Menstrual status: Irregular Periods). Contraception: oral contraception  Menopausal hormone therapy: not applicable        OB History    Gravida  4   Para  1   Term  0   Preterm  1   AB  2   Living  1     SAB  1   TAB  1   Ectopic      Multiple      Live Births  1              Patient Active Problem List   Diagnosis Date Noted  . IDA (iron deficiency anemia) 03/24/2018  . 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 SVT (supraventricular tachycardia) ([place]) 08/08/2014  . ADD (attention deficit disorder) 08/08/2014  . Generalized anxiety disorder 08/08/2014    Past Medical History:  Diagnosis Date  . Allergy   . Anemia   . Anxiety   . Dysmenorrhea   . Fibroid   . Heart murmur   . History of mania   . Hormone disorder   . STD (sexually transmitted disease)   . SVT (supraventricular tachycardia) ([place])   . Urinary incontinence     Past Surgical History:  Procedure Laterality Date  . APPENDECTOMY    . BREAST REDUCTION SURGERY Bilateral   . BREAST SURGERY    . CESAREAN SECTION    . COSMETIC SURGERY    . NASAL SINUS SURGERY       Current Outpatient Medications  Medication Sig Dispense Refill  . ALPRAZolam (XANAX) 1 MG tablet Take 1 mg by mouth 4 (four) times daily as needed for anxiety.   5  . cyclobenzaprine (FLEXERIL) 5 MG tablet Take 1-2 tablets (5-10 mg total) by mouth 3 (three) times daily as needed for muscle spasms. 60 tablet 1  . halobetasol (ULTRAVATE) 0.05 % ointment Apply 1 application topically 2 (two) times daily as needed (for outbreaks).   2  . medroxyPROGESTERone (PROVERA) 10 MG tablet Take one tablet by mouth twice daily. (Patient taking differently: Take 10 mg by mouth every 12 (twelve) hours. ) 60 tablet 0  . QUEtiapine (SEROQUEL) 50 MG tablet Take 50-150 mg by mouth at bedtime as needed (for sleep/maniac).    . valACYclovir (VALTREX) 1000 MG tablet Take 1/2 tablet daily for suppression, then 1/2 tablet twice daily for 3 days as needed for outbreaks (Patient taking differently: Take 500 mg by mouth daily as needed (for suppression/outbreaks.). Take 1/2 tablet daily for suppression, then 1/2 tablet twice daily for 3 days as needed for outbreaks) 30 tablet 12  . atenolol (  TENORMIN) 25 MG tablet Take 0.5 tablets (12.5 mg total) by mouth daily. (Patient not taking: Reported on 04/06/2018) 90 tablet 1   No current facility-administered medications for this visit.      ALLERGIES: Sulfa antibiotics; Adhesive [tape]; Codeine; and Hydrocodone  Family History  Problem Relation Age of Onset  . Liver disease Mother   . Heart attack Maternal Grandfather   . Heart disease Maternal Grandfather     Social History   Socioeconomic History  . Marital status: Divorced    Spouse name: Not on file  . Number of children: 1  . Years of education: Not on file  . Highest education level: Not on file  Occupational History  . Not on file  Social Needs  . Financial resource strain: Not on file  . Food insecurity:    Worry: Not on file    Inability: Not on file  . Transportation needs:    Medical: Not on  file    Non-medical: Not on file  Tobacco Use  . Smoking status: Never Smoker  . Smokeless tobacco: Never Used  Substance and Sexual Activity  . Alcohol use: Yes    Alcohol/week: 4.0 - 6.0 standard drinks    Types: 4 - 6 Standard drinks or equivalent per week  . Drug use: No  . Sexual activity: Yes    Partners: Male    Birth control/protection: None  Lifestyle  . Physical activity:    Days per week: Not on file    Minutes per session: Not on file  . Stress: Not on file  Relationships  . Social connections:    Talks on phone: Not on file    Gets together: Not on file    Attends religious service: Not on file    Active member of club or organization: Not on file    Attends meetings of clubs or organizations: Not on file    Relationship status: Not on file  . Intimate partner violence:    Fear of current or ex partner: Not on file    Emotionally abused: Not on file    Physically abused: Not on file    Forced sexual activity: Not on file  Other Topics Concern  . 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   HENT: Negative.   Eyes: Negative.   Respiratory: Negative.   Cardiovascular: Positive for palpitations.  Gastrointestinal: Negative.   Genitourinary:       Incontinence   Musculoskeletal: Positive for myalgias.  Skin: Positive for rash.  Neurological: Negative.   Endo/Heme/Allergies: Negative.   Psychiatric/Behavioral: Negative.     PHYSICAL EXAMINATION:    BP 124/86 (BP Location: Right Arm, Patient Position: Sitting, Cuff Size: Normal)   Pulse 66   Wt 142 lb 9.6 oz (64.7 kg)   BMI 23.02 kg/m     General appearance: alert, cooperative and appears stated age Neck: no adenopathy, supple, symmetrical, trachea midline and thyroid normal to inspection and palpation Heart: regular rate and rhythm Lungs: CTAB Abdomen: soft, non-tender; bowel sounds normal; no masses,  no  organomegaly Extremities: normal, atraumatic, no cyanosis Skin: normal color, texture and turgor, no rashes or lesions Lymph: normal cervical supraclavicular and inguinal nodes Neurologic: grossly normal    ASSESSMENT Fibroid uterus, likely adenomyosis Menometrorrhagia on OCP's leading to anemia Iron deficient anemia, s/p iron transfusion, continues to  bleed SVT, having cardiac ablation later today Urinary incontinence, urodynamics were canceled secondary to an episode of SVT. The patient wants to proceed with hysterectomy, may choose to have urodynamics and further treatment in the future    PLAN The patient desires definitive surgery, we discussed possible option of hysteroscopic resection of one of her fibroids, she declines Discussed total laparoscopic hysterectomy, bilateral salpingectomies and cystoscopy. Reviewed the risks of the procedure, including infection, bleeding, damage to bowel/badder/vessels/ureters. Discussed the possible need for laparotomy. Discussed post operative recovery and risk of cuff dehiscence. All of her questions were answered Given her current anemia, her risk of blood transfusion is increased    An After Visit Summary was printed and given to the patient.

## 2018-04-06 ENCOUNTER — Ambulatory Visit (HOSPITAL_COMMUNITY)
Admission: RE | Admit: 2018-04-06 | Discharge: 2018-04-08 | Disposition: A | Discharge status: Home / Self Care | Payer: BLUE CROSS/BLUE SHIELD | Source: Ambulatory Visit | Attending: Internal Medicine | Admitting: Internal Medicine

## 2018-04-06 ENCOUNTER — Other Ambulatory Visit: Payer: BLUE CROSS/BLUE SHIELD

## 2018-04-06 ENCOUNTER — Encounter (HOSPITAL_COMMUNITY)
Admission: RE | Disposition: A | Discharge status: Home / Self Care | Payer: Self-pay | Source: Ambulatory Visit | Attending: Internal Medicine

## 2018-04-06 ENCOUNTER — Other Ambulatory Visit: Payer: Self-pay

## 2018-04-06 ENCOUNTER — Ambulatory Visit (INDEPENDENT_AMBULATORY_CARE_PROVIDER_SITE_OTHER): Payer: BLUE CROSS/BLUE SHIELD | Admitting: Obstetrics and Gynecology

## 2018-04-06 ENCOUNTER — Encounter: Payer: Self-pay | Admitting: Obstetrics and Gynecology

## 2018-04-06 VITALS — BP 124/86 | HR 66 | Wt 142.6 lb

## 2018-04-06 DIAGNOSIS — N840 Polyp of corpus uteri: Secondary | ICD-10-CM | POA: Insufficient documentation

## 2018-04-06 DIAGNOSIS — N921 Excessive and frequent menstruation with irregular cycle: Secondary | ICD-10-CM

## 2018-04-06 DIAGNOSIS — Z888 Allergy status to other drugs, medicaments and biological substances status: Secondary | ICD-10-CM | POA: Diagnosis not present

## 2018-04-06 DIAGNOSIS — Z9889 Other specified postprocedural states: Secondary | ICD-10-CM | POA: Diagnosis not present

## 2018-04-06 DIAGNOSIS — N800 Endometriosis of uterus: Secondary | ICD-10-CM | POA: Insufficient documentation

## 2018-04-06 DIAGNOSIS — Z885 Allergy status to narcotic agent status: Secondary | ICD-10-CM | POA: Diagnosis not present

## 2018-04-06 DIAGNOSIS — Z8249 Family history of ischemic heart disease and other diseases of the circulatory system: Secondary | ICD-10-CM | POA: Insufficient documentation

## 2018-04-06 DIAGNOSIS — I471 Supraventricular tachycardia: Secondary | ICD-10-CM | POA: Insufficient documentation

## 2018-04-06 DIAGNOSIS — Z79899 Other long term (current) drug therapy: Secondary | ICD-10-CM | POA: Diagnosis not present

## 2018-04-06 DIAGNOSIS — D509 Iron deficiency anemia, unspecified: Secondary | ICD-10-CM | POA: Diagnosis not present

## 2018-04-06 DIAGNOSIS — R011 Cardiac murmur, unspecified: Secondary | ICD-10-CM | POA: Insufficient documentation

## 2018-04-06 DIAGNOSIS — G5601 Carpal tunnel syndrome, right upper limb: Secondary | ICD-10-CM | POA: Diagnosis not present

## 2018-04-06 DIAGNOSIS — Z8619 Personal history of other infectious and parasitic diseases: Secondary | ICD-10-CM | POA: Insufficient documentation

## 2018-04-06 DIAGNOSIS — Z882 Allergy status to sulfonamides status: Secondary | ICD-10-CM | POA: Diagnosis not present

## 2018-04-06 DIAGNOSIS — Z793 Long term (current) use of hormonal contraceptives: Secondary | ICD-10-CM | POA: Insufficient documentation

## 2018-04-06 DIAGNOSIS — N888 Other specified noninflammatory disorders of cervix uteri: Secondary | ICD-10-CM | POA: Diagnosis not present

## 2018-04-06 DIAGNOSIS — D259 Leiomyoma of uterus, unspecified: Secondary | ICD-10-CM

## 2018-04-06 DIAGNOSIS — D500 Iron deficiency anemia secondary to blood loss (chronic): Secondary | ICD-10-CM

## 2018-04-06 DIAGNOSIS — N838 Other noninflammatory disorders of ovary, fallopian tube and broad ligament: Secondary | ICD-10-CM | POA: Diagnosis not present

## 2018-04-06 DIAGNOSIS — F411 Generalized anxiety disorder: Secondary | ICD-10-CM | POA: Insufficient documentation

## 2018-04-06 DIAGNOSIS — Z90710 Acquired absence of both cervix and uterus: Secondary | ICD-10-CM | POA: Diagnosis present

## 2018-04-06 HISTORY — PX: SVT ABLATION: EP1225

## 2018-04-06 LAB — PREGNANCY, URINE: Preg Test, Ur: NEGATIVE

## 2018-04-06 SURGERY — SVT ABLATION
Anesthesia: LOCAL

## 2018-04-06 MED ORDER — SODIUM CHLORIDE 0.9 % IV SOLN
INTRAVENOUS | Status: DC
  Administered 2018-04-06: 11:00:00 via INTRAVENOUS

## 2018-04-06 MED ORDER — FENTANYL CITRATE (PF) 100 MCG/2ML IJ SOLN: 25.0000 ug | INTRAMUSCULAR | Status: DC | PRN

## 2018-04-06 MED ORDER — SODIUM CHLORIDE 0.9 % IV SOLN: 250.0000 mL | INTRAVENOUS | Status: DC | PRN

## 2018-04-06 MED ORDER — HEPARIN (PORCINE) IN NACL 1000-0.9 UT/500ML-% IV SOLN
INTRAVENOUS | Status: AC
  Filled 2018-04-06: qty 500

## 2018-04-06 MED ORDER — FENTANYL CITRATE (PF) 100 MCG/2ML IJ SOLN
INTRAMUSCULAR | Status: AC
  Filled 2018-04-06: qty 2

## 2018-04-06 MED ORDER — BUPIVACAINE HCL (PF) 0.25 % IJ SOLN
INTRAMUSCULAR | Status: AC
  Filled 2018-04-06: qty 30

## 2018-04-06 MED ORDER — MIDAZOLAM HCL 5 MG/5ML IJ SOLN
INTRAMUSCULAR | Status: AC
  Filled 2018-04-06: qty 5

## 2018-04-06 MED ORDER — HALOBETASOL PROPIONATE 0.05 % EX OINT
1.0000 "application " | TOPICAL_OINTMENT | Freq: Two times a day (BID) | CUTANEOUS | Status: DC | PRN
  Filled 2018-04-06: qty 1

## 2018-04-06 MED ORDER — QUETIAPINE FUMARATE 50 MG PO TABS: 50.0000 mg | ORAL_TABLET | Freq: Every evening | ORAL | Status: DC | PRN

## 2018-04-06 MED ORDER — ISOPROTERENOL HCL 0.2 MG/ML IJ SOLN
INTRAMUSCULAR | Status: AC
  Filled 2018-04-06: qty 5

## 2018-04-06 MED ORDER — ALPRAZOLAM 0.5 MG PO TABS: 1.0000 mg | ORAL_TABLET | Freq: Four times a day (QID) | ORAL | Status: DC | PRN

## 2018-04-06 MED ORDER — ISOPROTERENOL HCL 0.2 MG/ML IJ SOLN
INTRAVENOUS | Status: DC | PRN
  Administered 2018-04-06: 4 ug/min via INTRAVENOUS

## 2018-04-06 MED ORDER — HEPARIN (PORCINE) IN NACL 1000-0.9 UT/500ML-% IV SOLN
INTRAVENOUS | Status: DC | PRN
  Administered 2018-04-06: 500 mL

## 2018-04-06 MED ORDER — SODIUM CHLORIDE 0.9% FLUSH: 3.0000 mL | INTRAVENOUS | Status: DC | PRN

## 2018-04-06 MED ORDER — SODIUM CHLORIDE 0.9 % IV SOLN
INTRAVENOUS | Status: DC | PRN
  Administered 2018-04-06: 4 ug/min via INTRAVENOUS

## 2018-04-06 MED ORDER — MIDAZOLAM HCL 5 MG/5ML IJ SOLN
INTRAMUSCULAR | Status: DC | PRN
  Administered 2018-04-06 (×5): 1 mg via INTRAVENOUS
  Administered 2018-04-06: 2 mg via INTRAVENOUS
  Administered 2018-04-06 (×4): 1 mg via INTRAVENOUS

## 2018-04-06 MED ORDER — ONDANSETRON HCL 4 MG/2ML IJ SOLN: 4.0000 mg | Freq: Four times a day (QID) | INTRAMUSCULAR | Status: DC | PRN

## 2018-04-06 MED ORDER — FENTANYL CITRATE (PF) 100 MCG/2ML IJ SOLN
INTRAMUSCULAR | Status: DC | PRN
  Administered 2018-04-06: 25 ug via INTRAVENOUS
  Administered 2018-04-06 (×2): 12.5 ug via INTRAVENOUS
  Administered 2018-04-06: 25 ug via INTRAVENOUS
  Administered 2018-04-06 (×6): 12.5 ug via INTRAVENOUS

## 2018-04-06 MED ORDER — CYCLOBENZAPRINE HCL 10 MG PO TABS: 5.0000 mg | ORAL_TABLET | Freq: Three times a day (TID) | ORAL | Status: DC | PRN

## 2018-04-06 MED ORDER — ACETAMINOPHEN 325 MG PO TABS: 650.0000 mg | ORAL_TABLET | ORAL | Status: DC | PRN

## 2018-04-06 MED ORDER — BUPIVACAINE HCL (PF) 0.25 % IJ SOLN
INTRAMUSCULAR | Status: DC | PRN
  Administered 2018-04-06: 30 mL

## 2018-04-06 MED ORDER — SODIUM CHLORIDE 0.9% FLUSH
3.0000 mL | Freq: Two times a day (BID) | INTRAVENOUS | Status: DC
  Administered 2018-04-06: 3 mL via INTRAVENOUS

## 2018-04-06 SURGICAL SUPPLY — 11 items
BAG SNAP BAND KOVER 36X36 (MISCELLANEOUS) ×1 IMPLANT
CATH EZ STEER NAV 4MM D-F CUR (ABLATOR) ×1 IMPLANT
CATH HEX JOSEPH 2-5-2 65CM 6F (CATHETERS) ×1 IMPLANT
CATH JOSEPH QUAD ALLRED 6F REP (CATHETERS) ×2 IMPLANT
PACK EP LATEX FREE (CUSTOM PROCEDURE TRAY) ×2
PACK EP LF (CUSTOM PROCEDURE TRAY) ×1 IMPLANT
PAD PRO RADIOLUCENT 2001M-C (PAD) ×2 IMPLANT
PATCH CARTO3 (PAD) ×1 IMPLANT
SHEATH PINNACLE 6F 10CM (SHEATH) ×2 IMPLANT
SHEATH PINNACLE 7F 10CM (SHEATH) ×1 IMPLANT
SHEATH PINNACLE 8F 10CM (SHEATH) ×1 IMPLANT

## 2018-04-06 NOTE — Telephone Encounter (Signed)
See next phone encounters for additional information.   Routing to Dr Talbert Nan. Encounter closed.

## 2018-04-06 NOTE — Interval H&P Note (Signed)
History and Physical Interval Note:  04/06/2018 1:29 PM  Lindsey Adams  has presented today for surgery, with the diagnosis of svt  The various methods of treatment have been discussed with the patient and family. After consideration of risks, benefits and other options for treatment, the patient has consented to  Procedure(s): SVT ABLATION (N/A) as a surgical intervention .  The patient's history has been reviewed, patient examined, no change in status, stable for surgery.  I have reviewed the patient's chart and labs.  Questions were answered to the patient's satisfaction.     Cristopher Peru

## 2018-04-06 NOTE — Progress Notes (Signed)
Spoke with Lindsey Adams in cath lab - sheaths pulled and bedrest started at 1630 - bedrest for 6hrs. Patient and primary RN, Rey, made aware.

## 2018-04-07 ENCOUNTER — Encounter (HOSPITAL_COMMUNITY): Payer: Self-pay | Admitting: Internal Medicine

## 2018-04-07 ENCOUNTER — Ambulatory Visit (HOSPITAL_COMMUNITY): Payer: BLUE CROSS/BLUE SHIELD

## 2018-04-07 ENCOUNTER — Ambulatory Visit (HOSPITAL_COMMUNITY): Payer: BLUE CROSS/BLUE SHIELD | Admitting: Certified Registered Nurse Anesthetist

## 2018-04-07 ENCOUNTER — Encounter (HOSPITAL_COMMUNITY)
Admission: RE | Disposition: A | Discharge status: Home / Self Care | Payer: Self-pay | Source: Ambulatory Visit | Attending: Internal Medicine

## 2018-04-07 ENCOUNTER — Ambulatory Visit (HOSPITAL_COMMUNITY)
Admission: RE | Admit: 2018-04-07 | Payer: BLUE CROSS/BLUE SHIELD | Source: Ambulatory Visit | Admitting: Obstetrics and Gynecology

## 2018-04-07 DIAGNOSIS — G5601 Carpal tunnel syndrome, right upper limb: Secondary | ICD-10-CM | POA: Diagnosis not present

## 2018-04-07 DIAGNOSIS — I471 Supraventricular tachycardia: Secondary | ICD-10-CM

## 2018-04-07 DIAGNOSIS — R0989 Other specified symptoms and signs involving the circulatory and respiratory systems: Secondary | ICD-10-CM | POA: Diagnosis not present

## 2018-04-07 DIAGNOSIS — F411 Generalized anxiety disorder: Secondary | ICD-10-CM | POA: Diagnosis not present

## 2018-04-07 DIAGNOSIS — Z888 Allergy status to other drugs, medicaments and biological substances status: Secondary | ICD-10-CM | POA: Diagnosis not present

## 2018-04-07 DIAGNOSIS — N852 Hypertrophy of uterus: Secondary | ICD-10-CM | POA: Diagnosis not present

## 2018-04-07 DIAGNOSIS — Z79899 Other long term (current) drug therapy: Secondary | ICD-10-CM | POA: Diagnosis not present

## 2018-04-07 DIAGNOSIS — N921 Excessive and frequent menstruation with irregular cycle: Secondary | ICD-10-CM | POA: Diagnosis not present

## 2018-04-07 DIAGNOSIS — D509 Iron deficiency anemia, unspecified: Secondary | ICD-10-CM | POA: Diagnosis not present

## 2018-04-07 DIAGNOSIS — N888 Other specified noninflammatory disorders of cervix uteri: Secondary | ICD-10-CM | POA: Diagnosis not present

## 2018-04-07 DIAGNOSIS — Z885 Allergy status to narcotic agent status: Secondary | ICD-10-CM | POA: Diagnosis not present

## 2018-04-07 DIAGNOSIS — N838 Other noninflammatory disorders of ovary, fallopian tube and broad ligament: Secondary | ICD-10-CM | POA: Diagnosis not present

## 2018-04-07 DIAGNOSIS — Z90710 Acquired absence of both cervix and uterus: Secondary | ICD-10-CM | POA: Diagnosis present

## 2018-04-07 DIAGNOSIS — N800 Endometriosis of uterus: Secondary | ICD-10-CM | POA: Diagnosis not present

## 2018-04-07 DIAGNOSIS — N840 Polyp of corpus uteri: Secondary | ICD-10-CM | POA: Diagnosis not present

## 2018-04-07 DIAGNOSIS — Z882 Allergy status to sulfonamides status: Secondary | ICD-10-CM | POA: Diagnosis not present

## 2018-04-07 DIAGNOSIS — D259 Leiomyoma of uterus, unspecified: Secondary | ICD-10-CM | POA: Diagnosis not present

## 2018-04-07 DIAGNOSIS — R011 Cardiac murmur, unspecified: Secondary | ICD-10-CM | POA: Diagnosis not present

## 2018-04-07 DIAGNOSIS — Z793 Long term (current) use of hormonal contraceptives: Secondary | ICD-10-CM | POA: Diagnosis not present

## 2018-04-07 HISTORY — PX: CYSTOSCOPY: SHX5120

## 2018-04-07 HISTORY — PX: TOTAL LAPAROSCOPIC HYSTERECTOMY WITH SALPINGECTOMY: SHX6742

## 2018-04-07 LAB — CBC
HCT: 33.7 % — ABNORMAL LOW (ref 36.0–46.0)
Hemoglobin: 10.3 g/dL — ABNORMAL LOW (ref 12.0–15.0)
MCH: 27.5 pg (ref 26.0–34.0)
MCHC: 30.6 g/dL (ref 30.0–36.0)
MCV: 90.1 fL (ref 80.0–100.0)
Platelets: 218 10*3/uL (ref 150–400)
RBC: 3.74 MIL/uL — ABNORMAL LOW (ref 3.87–5.11)
RDW: 20.5 % — ABNORMAL HIGH (ref 11.5–15.5)
WBC: 6.7 10*3/uL (ref 4.0–10.5)
nRBC: 0 % (ref 0.0–0.2)

## 2018-04-07 LAB — SURGICAL PCR SCREEN
MRSA, PCR: NEGATIVE
STAPHYLOCOCCUS AUREUS: POSITIVE — AB

## 2018-04-07 LAB — ABO/RH: ABO/RH(D): AB POS

## 2018-04-07 LAB — TYPE AND SCREEN
ABO/RH(D): AB POS
Antibody Screen: NEGATIVE

## 2018-04-07 SURGERY — HYSTERECTOMY, TOTAL, LAPAROSCOPIC, WITH SALPINGECTOMY
Anesthesia: General | Site: Bladder

## 2018-04-07 MED ORDER — ONDANSETRON HCL 4 MG/2ML IJ SOLN
INTRAMUSCULAR | Status: DC | PRN
  Administered 2018-04-07: 4 mg via INTRAVENOUS

## 2018-04-07 MED ORDER — HYDROMORPHONE HCL 1 MG/ML IJ SOLN: 0.2000 mg | INTRAMUSCULAR | Status: DC | PRN

## 2018-04-07 MED ORDER — LACTATED RINGERS IV SOLN
INTRAVENOUS | Status: DC
  Administered 2018-04-07: 04:00:00 via INTRAVENOUS

## 2018-04-07 MED ORDER — MUPIROCIN 2 % EX OINT
1.0000 "application " | TOPICAL_OINTMENT | Freq: Two times a day (BID) | CUTANEOUS | Status: DC
  Administered 2018-04-07: 1 via NASAL
  Filled 2018-04-07: qty 22

## 2018-04-07 MED ORDER — BUPIVACAINE HCL (PF) 0.25 % IJ SOLN
INTRAMUSCULAR | Status: DC | PRN
  Administered 2018-04-07: 8 mL

## 2018-04-07 MED ORDER — KETOROLAC TROMETHAMINE 30 MG/ML IJ SOLN
30.0000 mg | Freq: Once | INTRAMUSCULAR | Status: AC
  Administered 2018-04-07: 30 mg via INTRAVENOUS

## 2018-04-07 MED ORDER — FENTANYL CITRATE (PF) 250 MCG/5ML IJ SOLN
INTRAMUSCULAR | Status: AC
  Filled 2018-04-07: qty 5

## 2018-04-07 MED ORDER — MIDAZOLAM HCL 5 MG/5ML IJ SOLN
INTRAMUSCULAR | Status: DC | PRN
  Administered 2018-04-07: 2 mg via INTRAVENOUS

## 2018-04-07 MED ORDER — CHLORHEXIDINE GLUCONATE CLOTH 2 % EX PADS
6.0000 | MEDICATED_PAD | Freq: Every day | CUTANEOUS | Status: DC
  Administered 2018-04-07: 6 via TOPICAL

## 2018-04-07 MED ORDER — DOCUSATE SODIUM 100 MG PO CAPS
100.0000 mg | ORAL_CAPSULE | Freq: Two times a day (BID) | ORAL | Status: DC
  Administered 2018-04-07 – 2018-04-08 (×2): 100 mg via ORAL
  Filled 2018-04-07 (×2): qty 1

## 2018-04-07 MED ORDER — FENTANYL CITRATE (PF) 100 MCG/2ML IJ SOLN: 25.0000 ug | INTRAMUSCULAR | Status: DC | PRN

## 2018-04-07 MED ORDER — KETOROLAC TROMETHAMINE 30 MG/ML IJ SOLN
INTRAMUSCULAR | Status: AC
  Filled 2018-04-07: qty 1

## 2018-04-07 MED ORDER — ZOLPIDEM TARTRATE 5 MG PO TABS
5.0000 mg | ORAL_TABLET | Freq: Every evening | ORAL | Status: DC | PRN
  Administered 2018-04-08: 5 mg via ORAL
  Filled 2018-04-07: qty 1

## 2018-04-07 MED ORDER — SODIUM CHLORIDE 0.9 % IV SOLN: INTRAVENOUS | Status: DC | PRN

## 2018-04-07 MED ORDER — SUGAMMADEX SODIUM 200 MG/2ML IV SOLN
INTRAVENOUS | Status: DC | PRN
  Administered 2018-04-07: 200 mg via INTRAVENOUS

## 2018-04-07 MED ORDER — HYDROMORPHONE HCL 2 MG PO TABS
4.0000 mg | ORAL_TABLET | ORAL | Status: DC | PRN
  Administered 2018-04-07 – 2018-04-08 (×3): 4 mg via ORAL
  Filled 2018-04-07 (×3): qty 2

## 2018-04-07 MED ORDER — SODIUM CHLORIDE 0.9 % IR SOLN
Status: DC | PRN
  Administered 2018-04-07: 1000 mL

## 2018-04-07 MED ORDER — DEXAMETHASONE SODIUM PHOSPHATE 10 MG/ML IJ SOLN
INTRAMUSCULAR | Status: DC | PRN
  Administered 2018-04-07: 10 mg via INTRAVENOUS

## 2018-04-07 MED ORDER — PHENYLEPHRINE 40 MCG/ML (10ML) SYRINGE FOR IV PUSH (FOR BLOOD PRESSURE SUPPORT)
PREFILLED_SYRINGE | INTRAVENOUS | Status: DC | PRN
  Administered 2018-04-07: 40 ug via INTRAVENOUS

## 2018-04-07 MED ORDER — ACETAMINOPHEN 325 MG PO TABS: 650.0000 mg | ORAL_TABLET | ORAL | Status: DC | PRN

## 2018-04-07 MED ORDER — SODIUM CHLORIDE (PF) 0.9 % IJ SOLN
INTRAMUSCULAR | Status: AC
  Filled 2018-04-07: qty 40

## 2018-04-07 MED ORDER — ONDANSETRON HCL 4 MG PO TABS: 4.0000 mg | ORAL_TABLET | Freq: Four times a day (QID) | ORAL | Status: DC | PRN

## 2018-04-07 MED ORDER — MIDAZOLAM HCL 2 MG/2ML IJ SOLN
INTRAMUSCULAR | Status: AC
  Filled 2018-04-07: qty 2

## 2018-04-07 MED ORDER — ROCURONIUM BROMIDE 10 MG/ML (PF) SYRINGE
PREFILLED_SYRINGE | INTRAVENOUS | Status: DC | PRN
  Administered 2018-04-07: 50 mg via INTRAVENOUS
  Administered 2018-04-07: 20 mg via INTRAVENOUS

## 2018-04-07 MED ORDER — ALUM & MAG HYDROXIDE-SIMETH 200-200-20 MG/5ML PO SUSP: 30.0000 mL | ORAL | Status: DC | PRN

## 2018-04-07 MED ORDER — KCL IN DEXTROSE-NACL 20-5-0.45 MEQ/L-%-% IV SOLN
INTRAVENOUS | Status: DC
  Administered 2018-04-07 (×2): via INTRAVENOUS
  Filled 2018-04-07 (×4): qty 1000

## 2018-04-07 MED ORDER — MENTHOL 3 MG MT LOZG: 1.0000 | LOZENGE | OROMUCOSAL | Status: DC | PRN

## 2018-04-07 MED ORDER — SODIUM CHLORIDE 0.9 % IV SOLN
INTRAVENOUS | Status: DC | PRN
  Administered 2018-04-07: 60 mL

## 2018-04-07 MED ORDER — KETOROLAC TROMETHAMINE 30 MG/ML IJ SOLN
30.0000 mg | Freq: Four times a day (QID) | INTRAMUSCULAR | Status: DC
  Administered 2018-04-07 – 2018-04-08 (×3): 30 mg via INTRAVENOUS
  Filled 2018-04-07 (×3): qty 1

## 2018-04-07 MED ORDER — FENTANYL CITRATE (PF) 250 MCG/5ML IJ SOLN
INTRAMUSCULAR | Status: DC | PRN
  Administered 2018-04-07 (×2): 50 ug via INTRAVENOUS
  Administered 2018-04-07: 25 ug via INTRAVENOUS
  Administered 2018-04-07: 100 ug via INTRAVENOUS
  Administered 2018-04-07: 50 ug via INTRAVENOUS

## 2018-04-07 MED ORDER — PROPOFOL 10 MG/ML IV BOLUS
INTRAVENOUS | Status: AC
  Filled 2018-04-07: qty 20

## 2018-04-07 MED ORDER — LACTATED RINGERS IV SOLN
INTRAVENOUS | Status: DC
  Administered 2018-04-07: 09:00:00 via INTRAVENOUS

## 2018-04-07 MED ORDER — BUPIVACAINE HCL (PF) 0.25 % IJ SOLN
INTRAMUSCULAR | Status: AC
  Filled 2018-04-07: qty 30

## 2018-04-07 MED ORDER — ONDANSETRON HCL 4 MG/2ML IJ SOLN
4.0000 mg | Freq: Four times a day (QID) | INTRAMUSCULAR | Status: DC | PRN
  Administered 2018-04-07: 4 mg via INTRAVENOUS
  Filled 2018-04-07: qty 2

## 2018-04-07 MED ORDER — ROCURONIUM BROMIDE 50 MG/5ML IV SOSY
PREFILLED_SYRINGE | INTRAVENOUS | Status: AC
  Filled 2018-04-07: qty 30

## 2018-04-07 MED ORDER — ENOXAPARIN SODIUM 40 MG/0.4ML ~~LOC~~ SOLN: 40.0000 mg | SUBCUTANEOUS | Status: DC

## 2018-04-07 MED ORDER — TRAMADOL HCL 50 MG PO TABS: 50.0000 mg | ORAL_TABLET | Freq: Four times a day (QID) | ORAL | Status: DC | PRN

## 2018-04-07 MED ORDER — PANTOPRAZOLE SODIUM 40 MG IV SOLR
40.0000 mg | Freq: Every day | INTRAVENOUS | Status: DC
  Administered 2018-04-07: 40 mg via INTRAVENOUS
  Filled 2018-04-07: qty 40

## 2018-04-07 MED ORDER — ENOXAPARIN SODIUM 40 MG/0.4ML ~~LOC~~ SOLN
40.0000 mg | Freq: Once | SUBCUTANEOUS | Status: AC
  Administered 2018-04-07: 40 mg via SUBCUTANEOUS
  Filled 2018-04-07: qty 0.4

## 2018-04-07 MED ORDER — LIDOCAINE 2% (20 MG/ML) 5 ML SYRINGE
INTRAMUSCULAR | Status: DC | PRN
  Administered 2018-04-07: 100 mg via INTRAVENOUS

## 2018-04-07 MED ORDER — PROPOFOL 10 MG/ML IV BOLUS
INTRAVENOUS | Status: DC | PRN
  Administered 2018-04-07: 150 mg via INTRAVENOUS

## 2018-04-07 MED ORDER — CEFAZOLIN SODIUM-DEXTROSE 2-4 GM/100ML-% IV SOLN
2.0000 g | INTRAVENOUS | Status: AC
  Administered 2018-04-07: 2 g via INTRAVENOUS
  Filled 2018-04-07: qty 100

## 2018-04-07 MED ORDER — SODIUM CHLORIDE (PF) 0.9 % IJ SOLN
INTRAMUSCULAR | Status: DC | PRN
  Administered 2018-04-07: 10 mL

## 2018-04-07 MED ORDER — KETOROLAC TROMETHAMINE 30 MG/ML IJ SOLN: 30.0000 mg | Freq: Four times a day (QID) | INTRAMUSCULAR | Status: DC

## 2018-04-07 MED FILL — Bupivacaine HCl Preservative Free (PF) Inj 0.25%: INTRAMUSCULAR | Qty: 30 | Status: AC

## 2018-04-07 SURGICAL SUPPLY — 62 items
ADH SKN CLS APL DERMABOND .7 (GAUZE/BANDAGES/DRESSINGS) ×2
APL SRG 38 LTWT LNG FL B (MISCELLANEOUS)
APPLICATOR ARISTA FLEXITIP XL (MISCELLANEOUS) IMPLANT
CABLE HIGH FREQUENCY MONO STRZ (ELECTRODE) IMPLANT
CANISTER SUCT 3000ML PPV (MISCELLANEOUS) ×3 IMPLANT
CELL SAVER LIPIGURD (MISCELLANEOUS) ×2 IMPLANT
COVER MAYO STAND STRL (DRAPES) ×3 IMPLANT
COVER WAND RF STERILE (DRAPES) ×3 IMPLANT
DECANTER SPIKE VIAL GLASS SM (MISCELLANEOUS) ×9 IMPLANT
DERMABOND ADVANCED (GAUZE/BANDAGES/DRESSINGS) ×1
DERMABOND ADVANCED .7 DNX12 (GAUZE/BANDAGES/DRESSINGS) ×2 IMPLANT
DEVICE RETRIEVAL ALEXIS 14 (MISCELLANEOUS) IMPLANT
DURAPREP 26ML APPLICATOR (WOUND CARE) ×3 IMPLANT
EXTRT SYSTEM ALEXIS 14CM (MISCELLANEOUS) ×3
EXTRT SYSTEM ALEXIS 17CM (MISCELLANEOUS)
GLOVE BIOGEL PI IND STRL 7.0 (GLOVE) ×8 IMPLANT
GLOVE BIOGEL PI INDICATOR 7.0 (GLOVE) ×4
GLOVE ECLIPSE 6.5 STRL STRAW (GLOVE) ×3 IMPLANT
GOWN STRL REUS W/TWL LRG LVL3 (GOWN DISPOSABLE) ×12 IMPLANT
HARMONIC RUM II 2.5CM SILVER (DISPOSABLE) ×3
HARMONIC RUM II 3.0CM SILVER (DISPOSABLE)
HARMONIC RUM II 3.5CM SILVER (DISPOSABLE)
HARMONIC RUM II 4.0CM SILVER (DISPOSABLE)
HEMOSTAT ARISTA ABSORB 3G PWDR (MISCELLANEOUS) IMPLANT
LIGASURE VESSEL 5MM BLUNT TIP (ELECTROSURGICAL) ×3 IMPLANT
NEEDLE INSUFFLATION 120MM (ENDOMECHANICALS) ×3 IMPLANT
PACK LAPAROSCOPY BASIN (CUSTOM PROCEDURE TRAY) ×3 IMPLANT
PACK TRENDGUARD 450 HYBRID PRO (MISCELLANEOUS) IMPLANT
PACK TRENDGUARD 600 HYBRD PROC (MISCELLANEOUS) IMPLANT
POUCH LAPAROSCOPIC INSTRUMENT (MISCELLANEOUS) ×3 IMPLANT
PROTECTOR NERVE ULNAR (MISCELLANEOUS) ×6 IMPLANT
SCALPEL HRMNC RUM II 2.5 SILVR (DISPOSABLE) IMPLANT
SCALPEL HRMNC RUM II 3.0 SILVR (DISPOSABLE) IMPLANT
SCALPEL HRMNC RUM II 3.5 SILVR (DISPOSABLE) IMPLANT
SCALPEL HRMNC RUM II 4.0 SILVR (DISPOSABLE) IMPLANT
SCISSORS LAP 5X35 DISP (ENDOMECHANICALS) IMPLANT
SET CYSTO W/LG BORE CLAMP LF (SET/KITS/TRAYS/PACK) ×3 IMPLANT
SET IRRIG TUBING LAPAROSCOPIC (IRRIGATION / IRRIGATOR) ×3 IMPLANT
SET TRI-LUMEN FLTR TB AIRSEAL (TUBING) ×3 IMPLANT
SHEARS HARMONIC ACE PLUS 36CM (ENDOMECHANICALS) ×3 IMPLANT
SUT VIC AB 0 CT1 27 (SUTURE) ×3
SUT VIC AB 0 CT1 27XBRD ANBCTR (SUTURE) ×2 IMPLANT
SUT VICRYL 0 UR6 27IN ABS (SUTURE) ×3 IMPLANT
SUT VICRYL 4-0 PS2 18IN ABS (SUTURE) ×3 IMPLANT
SUT VLOC 180 0 9IN  GS21 (SUTURE) ×1
SUT VLOC 180 0 9IN GS21 (SUTURE) IMPLANT
SYR 50ML LL SCALE MARK (SYRINGE) ×6 IMPLANT
SYSTEM CONTND EXTRCTN KII BLLN (MISCELLANEOUS) IMPLANT
TIP RUMI ORANGE 6.7MMX12CM (TIP) IMPLANT
TIP UTERINE 5.1X6CM LAV DISP (MISCELLANEOUS) IMPLANT
TIP UTERINE 6.7X10CM GRN DISP (MISCELLANEOUS) ×1 IMPLANT
TIP UTERINE 6.7X6CM WHT DISP (MISCELLANEOUS) IMPLANT
TIP UTERINE 6.7X8CM BLUE DISP (MISCELLANEOUS) IMPLANT
TOWEL OR 17X24 6PK STRL BLUE (TOWEL DISPOSABLE) ×6 IMPLANT
TRAY FOLEY W/BAG SLVR 14FR (SET/KITS/TRAYS/PACK) ×3 IMPLANT
TRENDGUARD 450 HYBRID PRO PACK (MISCELLANEOUS) ×3
TRENDGUARD 600 HYBRID PROC PK (MISCELLANEOUS)
TROCAR ADV FIXATION 5X100MM (TROCAR) ×3 IMPLANT
TROCAR PORT AIRSEAL 5X120 (TROCAR) ×3 IMPLANT
TROCAR XCEL NON BLADE 8MM B8LT (ENDOMECHANICALS) ×3 IMPLANT
TROCAR XCEL NON-BLD 5MMX100MML (ENDOMECHANICALS) ×3 IMPLANT
WARMER LAPAROSCOPE (MISCELLANEOUS) ×3 IMPLANT

## 2018-04-07 NOTE — Transfer of Care (Signed)
Immediate Anesthesia Transfer of Care Note  Patient: Lindsey Adams  Procedure(s) Performed: TOTAL LAPAROSCOPIC HYSTERECTOMY WITH SALPINGECTOMY (Bilateral Abdomen) CYSTOSCOPY (N/A Bladder)  Patient Location: PACU  Anesthesia Type:General  Level of Consciousness: drowsy and patient cooperative  Airway & Oxygen Therapy: Patient Spontanous Breathing and Patient connected to nasal cannula oxygen  Post-op Assessment: Report given to RN and Post -op Vital signs reviewed and stable  Post vital signs: Reviewed and stable  Last Vitals:  Vitals Value Taken Time  BP 114/73 04/07/2018 12:30 PM  Temp    Pulse 84 04/07/2018 12:32 PM  Resp 16 04/07/2018 12:32 PM  SpO2 100 % 04/07/2018 12:32 PM  Vitals shown include unvalidated device data.  Last Pain:  Vitals:   04/07/18 8412  TempSrc: Oral  PainSc:       Patients Stated Pain Goal: 0 (82/08/13 8871)  Complications: No apparent anesthesia complications

## 2018-04-07 NOTE — H&P (Signed)
GYNECOLOGY  VISIT   HPI: 47 y.o.   Divorced White or Caucasian Not Hispanic or Latino  female   [phone] with No LMP recorded. (Menstrual status: Irregular Periods).   here for a pre-op visit. The patient has a long history of menometrorrhagia on OCP's leading to anemia. Ultrasound with 3 small myomas and likely adenomyosis. Endometrial biopsy was benign. Pap was normal last January. She has gotten iron transfusions. Her last hgb was 9.9 gm/dl last week. She was going to have surgery in December, surgery has been moved up and she has been started on provera for very heavy bleeding.  She has SVT and is having an ablation today, surgery planned for tomorrow.  She stopped her OCP's weeks ago, is on provera to help prevent bleeding.     No tobacco, social ETOH.  GYNECOLOGIC HISTORY: No LMP recorded. (Menstrual status: Irregular Periods). Contraception: oral contraception  Menopausal hormone therapy: not applicable                OB History    Gravida  4   Para  1   Term  0   Preterm  1   AB  2   Living  1     SAB  1   TAB  1   Ectopic      Multiple      Live Births  1                  Patient Active Problem List   Diagnosis Date Noted  . IDA (iron deficiency anemia) 03/24/2018  . 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 SVT (supraventricular tachycardia) ([place]) 08/08/2014  . ADD (attention deficit disorder) 08/08/2014  . Generalized anxiety disorder 08/08/2014        Past Medical History:  Diagnosis Date  . Allergy   . Anemia   . Anxiety   . Dysmenorrhea   . Fibroid   . Heart murmur   . History of mania   . Hormone disorder   . STD (sexually transmitted disease)   . SVT (supraventricular tachycardia) ([place])   . Urinary incontinence          Past Surgical History:  Procedure Laterality Date  . APPENDECTOMY    . BREAST REDUCTION SURGERY Bilateral   . BREAST SURGERY    . CESAREAN  SECTION    . COSMETIC SURGERY    . NASAL SINUS SURGERY            Current Outpatient Medications  Medication Sig Dispense Refill  . ALPRAZolam (XANAX) 1 MG tablet Take 1 mg by mouth 4 (four) times daily as needed for anxiety.   5  . cyclobenzaprine (FLEXERIL) 5 MG tablet Take 1-2 tablets (5-10 mg total) by mouth 3 (three) times daily as needed for muscle spasms. 60 tablet 1  . halobetasol (ULTRAVATE) 0.05 % ointment Apply 1 application topically 2 (two) times daily as needed (for outbreaks).   2  . medroxyPROGESTERone (PROVERA) 10 MG tablet Take one tablet by mouth twice daily. (Patient taking differently: Take 10 mg by mouth every 12 (twelve) hours. ) 60 tablet 0  . QUEtiapine (SEROQUEL) 50 MG tablet Take 50-150 mg by mouth at bedtime as needed (for sleep/maniac).    . valACYclovir (VALTREX) 1000 MG tablet Take 1/2 tablet daily for suppression, then 1/2 tablet twice daily for 3 days as needed for outbreaks (Patient taking differently: Take 500 mg by mouth daily as  needed (for suppression/outbreaks.). Take 1/2 tablet daily for suppression, then 1/2 tablet twice daily for 3 days as needed for outbreaks) 30 tablet 12  . atenolol (TENORMIN) 25 MG tablet Take 0.5 tablets (12.5 mg total) by mouth daily. (Patient not taking: Reported on 04/06/2018) 90 tablet 1   No current facility-administered medications for this visit.      ALLERGIES: Sulfa antibiotics; Adhesive [tape]; Codeine; and Hydrocodone       Family History  Problem Relation Age of Onset  . Liver disease Mother   . Heart attack Maternal Grandfather   . Heart disease Maternal Grandfather     Social History        Socioeconomic History  . Marital status: Divorced    Spouse name: Not on file  . Number of children: 1  . Years of education: Not on file  . Highest education level: Not on file  Occupational History  . Not on file  Social Needs  . Financial resource strain: Not on file  . Food insecurity:     Worry: Not on file    Inability: Not on file  . Transportation needs:    Medical: Not on file    Non-medical: Not on file  Tobacco Use  . Smoking status: Never Smoker  . Smokeless tobacco: Never Used  Substance and Sexual Activity  . Alcohol use: Yes    Alcohol/week: 4.0 - 6.0 standard drinks    Types: 4 - 6 Standard drinks or equivalent per week  . Drug use: No  . Sexual activity: Yes    Partners: Male    Birth control/protection: None  Lifestyle  . Physical activity:    Days per week: Not on file    Minutes per session: Not on file  . Stress: Not on file  Relationships  . Social connections:    Talks on phone: Not on file    Gets together: Not on file    Attends religious service: Not on file    Active member of club or organization: Not on file    Attends meetings of clubs or organizations: Not on file    Relationship status: Not on file  . Intimate partner violence:    Fear of current or ex partner: Not on file    Emotionally abused: Not on file    Physically abused: Not on file    Forced sexual activity: Not on file  Other Topics Concern  . 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   HENT: Negative.   Eyes: Negative.   Respiratory: Negative.   Cardiovascular: Positive for palpitations.  Gastrointestinal: Negative.   Genitourinary:       Incontinence   Musculoskeletal: Positive for myalgias.  Skin: Positive for rash.  Neurological: Negative.   Endo/Heme/Allergies: Negative.   Psychiatric/Behavioral: Negative.     PHYSICAL EXAMINATION:    BP 124/86 (BP Location: Right Arm, Patient Position: Sitting, Cuff Size: Normal)   Pulse 66   Wt 142 lb 9.6 oz (64.7 kg)   BMI 23.02 kg/m     General appearance: alert, cooperative and appears stated age Neck: no adenopathy, supple, symmetrical, trachea midline and thyroid  normal to inspection and palpation Heart: regular rate and rhythm Lungs: CTAB Abdomen: soft, non-tender; bowel sounds normal; no masses,  no organomegaly Extremities: normal, atraumatic, no cyanosis Skin: normal color, texture and  turgor, no rashes or lesions Lymph: normal cervical supraclavicular and inguinal nodes Neurologic: grossly normal    ASSESSMENT Fibroid uterus, likely adenomyosis Menometrorrhagia on OCP's leading to anemia Iron deficient anemia, s/p iron transfusion, continues to bleed SVT, having cardiac ablation later today Urinary incontinence, urodynamics were canceled secondary to an episode of SVT. The patient wants to proceed with hysterectomy, may choose to have urodynamics and further treatment in the future    PLAN The patient desires definitive surgery, we discussed possible option of hysteroscopic resection of one of her fibroids, she declines Discussed total laparoscopic hysterectomy, bilateral salpingectomies and cystoscopy. Reviewed the risks of the procedure, including infection, bleeding, damage to bowel/badder/vessels/ureters. Discussed the possible need for laparotomy. Discussed post operative recovery and risk of cuff dehiscence. All of her questions were answered Given her current anemia, her risk of blood transfusion is increased    An After Visit Summary was printed and given to the patient.

## 2018-04-07 NOTE — Progress Notes (Signed)
Spoke to Dr. Roanna Banning for Dr. Nyoka Cowden regarding Hgb. Per Dr. Roanna Banning we do not need a repeat Hgb.

## 2018-04-07 NOTE — Progress Notes (Addendum)
Progress Note  Patient Name: Lindsey Adams Date of Encounter: 04/07/2018  Primary electrophysiologist: Dr. Lovena Le  Subjective   Feels OK, no CP or SOB, doesn't like our gowns, prefers not to wear anything then the gowns available on the floor.  Inpatient Medications    Scheduled Meds: . Chlorhexidine Gluconate Cloth  6 each Topical Daily  . enoxaparin (LOVENOX) injection  40 mg Subcutaneous Once  . mupirocin ointment  1 application Nasal BID  . sodium chloride flush  3 mL Intravenous Q12H   Continuous Infusions: . sodium chloride    .  ceFAZolin (ANCEF) IV    . lactated ringers 125 mL/hr at 04/07/18 0600   PRN Meds: sodium chloride, acetaminophen, ALPRAZolam, cyclobenzaprine, fentaNYL (SUBLIMAZE) injection, halobetasol, ondansetron (ZOFRAN) IV, QUEtiapine, sodium chloride flush   Vital Signs    Vitals:   04/06/18 1700 04/06/18 1759 04/06/18 2058 04/07/18 0608  BP: (!) 96/43 (!) 140/93 (!) 101/58 132/84  Pulse: 85 87 88 75  Resp: 20 (!) 22 15 18   Temp:  97.7 F (36.5 C) 97.8 F (36.6 C) 98.2 F (36.8 C)  TempSrc:  Oral Oral Oral  SpO2: 100% 100% 100% 99%  Weight:  68 kg    Height:  5\' 6"  (1.676 m)      Intake/Output Summary (Last 24 hours) at 04/07/2018 0818 Last data filed at 04/07/2018 0600 Gross per 24 hour  Intake 602.27 ml  Output 600 ml  Net 2.27 ml   Filed Weights   04/06/18 1022 04/06/18 1759  Weight: 66.2 kg 68 kg    Telemetry    SR, one 4beeat NSVT - Personally Reviewed  ECG    SR - Personally Reviewed  Physical Exam   GEN: No acute distress.   Neck: No JVD Cardiac: RRR, no murmurs, rubs, or gallops.  Respiratory: CTA b/l GI: Soft, nontender, non-distended  MS: No edema; No deformity. Neuro:  Nonfocal  Psych: Normal affect   R groin is soft, non-tender, no bleeding or hematoma, 3+ pedal pulses,  R IJ site also stable without bleeding/hematoma, tenderness  Labs    Chemistry Recent Labs  Lab 04/03/18 1218  NA 140  K 4.0    CL 101  CO2 22  GLUCOSE 91  BUN 7  CREATININE 0.64  CALCIUM 9.6  GFRNONAA 107  GFRAA 123     Hematology Recent Labs  Lab 04/03/18 1218  WBC 4.9  RBC 3.64*  HGB 9.9*  HCT 30.2*  MCV 83  MCH 27.2  MCHC 32.8  RDW 18.8*  PLT 331    Cardiac EnzymesNo results for input(s): TROPONINI in the last 168 hours. No results for input(s): TROPIPOC in the last 168 hours.   BNPNo results for input(s): BNP, PROBNP in the last 168 hours.   DDimer No results for input(s): DDIMER in the last 168 hours.   Radiology    No results found.  Cardiac Studies   04/06/18 EPS/Ablation. Dr. Lovena Le CONCLUSIONS:  1. Sinus rhythm upon presentation.  2. 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 Successful radiofrequency modification of the slow AV nodal pathway using isuprel 4. No inducible arrhythmias following ablation.  5. No early apparent complications.   Patient Profile     47 y.o. 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  We will ask Dr. Talbert Nan to take over attending service    For questions or updates, please contact [place] Please consult [email or web address] for contact info under        Signed, Baldwin Jamaica, PA-C  04/07/2018, 8:18 AM    EP Attending  Patient seen and examined. Agree with above. She is stable for DC home from my perspective. She is pending gyn surgery today.   Lindsey Adams.D.

## 2018-04-07 NOTE — Op Note (Signed)
Preoperative Diagnosis: Fibroid uterus, menometrorrhagia, iron deficient anemia  Postoperative Diagnosis: Same  Procedure:  Total Laparoscopic Hysterectomy with bilateral salpingectomies and cystoscopy  Surgeon: Dr Sumner Boast  Assistant: Dr Edwinna Areola, an MD assistant was necessary for tissue manipulation, retraction and positioning due to the complexity of the case and hospital policies  Anesthesia: General  EBL: 20   Fluids: 700 cc LE  Urine output: 315 cc  Complications: none  Indications for surgery: The patient is a 47 year old female, who presented with a long history of menometrorrhagia on OCP's leading to anemia. Work up included an ultrasound with 3 small myomas and likely adenomyosis. Endometrial biopsy was benign.  The patient is aware of the risks and complications involved with the surgery and consent was obtained prior to the procedure.  Findings: EUA: slightly enlarged, irregularly shaped uterus, no adnexal masses. Laparoscopy: fibroid uterus, normal adnexa bilaterally, normal liver edge, minor adhesions from prior cesarean section of the bladder to the uterus  Procedure: The patient was taken to the operating room with an IV in placed, preoperative antibiotics had been administered. She was placed in the dorsal lithotomy position. General anesthesia was administered. She was prepped and draped in the usual sterile fashion for an abdominal, vaginal surgery. A rumi uterine manipulator was placed, using a # 2.5 cup and a 10 cm extender. A foley catheter was placed.    The umbilicus was everted, injected with 0.25% marcaine and incised with a # 11 blade. 2 towel clips were used to elevated the umbilicus and a veress needle was placed into the abdominal cavity. The abdominal cavity was insufflated with CO2, with normal intraabdominal pressures. After adequate pneumo-insufflation the veress needle was removed and the 5 mm laparoscope was placed into the abdominal cavity  using the opti-view trocar. The patient was placed in trendelenburg and the abdominal pelvic cavity was inspected. 3 more trocars were placed: 1 in each lower quadrant approximately 3 cm medial to and superior to the anterior superior iliac spine and one in the midline approximately 6 cm above the pubic symphysis in the midline. These areas were injected with 0.25% marcaine, incised with a #11 blade and all trocars were inserted with direct visualization with the laparoscope. A # 5 airseal trocar was placed in the RLQ, a 5 mm trocar in the LLQ and a #8 airseal in the midline. The abdominal pelvic cavity was again inspected. A mixture of 30 cc of Robivacaine and 30 cc of NS was place in the pelvic cavity.   The left tube was elevated from the pelvic sidewall, cauterized and cut with the ligasure device. The mesosalpinx was cauterized and cut with the ligasure device. The tube was separated from the uterus using the ligasure device and removed through the midline trocar. The left round ligament was cauterized and cut with the ligasure device and the anterior and posterior leafs of the broad ligament were taken down with the ligasure device. The harmonic scalpel was then used to take down the bladder flap and skeltonize the vessels. The left uterine vessels were then clamped, cauterized and ligated with the ligasure device. Hemostasis was excellent. The same procedure was repeated on the right.   Using the rumi manipulator the uterus was pushed up in the pelvic cavity and the harmonic scalpel was used to separate the cervix from the vagina using the harmonic energy. The uterus was too large to be delivered vaginally, so an alexis bag was passed through the vagina into the abdominal  cavity and the uterus was placed in the bag. The end of the bag was brought out through the vagina and the uterus was morcellated in the bag and removed vaginally with the bag remaining intact. An occluder was placed in the vagina to  maintain pneumoperitoneum. The vaginal cuff was then closed with a 0 V-lock suture. Hemostasis was excellent. The abdominal pelvic cavity was irrigated and suctioned dry. Pressure was released and hemostasis remained excellent. Arista was placed over the vaginal cuff and adnexa.   The abdominal cavity was desufflated and the trocars were removed. The skin was closed with subcuticular stiches of 4-0 vicryl and dermabond was placed over the incisions.  The foley catheter was removed and cystoscopy was performed using a 70 degree scope. Both ureters expelled urine, no bladder abnormalities were noted. The bladder was allowed to drain and the cystoscope was removed.   The patient's abdomen and perineum were cleansed and she was taken out of the dorsal lithotomy position. Upon awakening she was extubated and taken to the recovery room in stable condition. The sponge and instrument counts were correct.

## 2018-04-07 NOTE — Anesthesia Postprocedure Evaluation (Signed)
Anesthesia Post Note  Patient: Lindsey Adams  Procedure(s) Performed: TOTAL LAPAROSCOPIC HYSTERECTOMY WITH SALPINGECTOMY (Bilateral Abdomen) CYSTOSCOPY (N/A Bladder)     Patient location during evaluation: PACU Anesthesia Type: General Level of consciousness: awake Pain management: pain level controlled Vital Signs Assessment: post-procedure vital signs reviewed and stable Respiratory status: spontaneous breathing Cardiovascular status: stable Anesthetic complications: no    Last Vitals:  Vitals:   04/07/18 0608 04/07/18 1230  BP: 132/84   Pulse: 75   Resp: 18   Temp: 36.8 C 36.5 C  SpO2: 99%     Last Pain:  Vitals:   04/07/18 1230  TempSrc:   PainSc: Asleep                 Laelynn Blizzard

## 2018-04-07 NOTE — Progress Notes (Signed)
Pt's right neck dressing clean dry and intact.

## 2018-04-07 NOTE — Anesthesia Preprocedure Evaluation (Addendum)
Anesthesia Evaluation  Patient identified by MRN, date of birth, ID band Patient awake    Reviewed: Allergy & Precautions, NPO status , Patient's Chart, lab work & pertinent test results  Airway Mallampati: II  TM Distance: >3 FB     Dental   Pulmonary neg pulmonary ROS,    breath sounds clear to auscultation       Cardiovascular + Valvular Problems/Murmurs  Rhythm:Regular Rate:Normal     Neuro/Psych    GI/Hepatic negative GI ROS, Neg liver ROS,   Endo/Other  negative endocrine ROS  Renal/GU negative Renal ROS     Musculoskeletal   Abdominal   Peds  Hematology   Anesthesia Other Findings   Reproductive/Obstetrics                            Anesthesia Physical Anesthesia Plan  ASA: III  Anesthesia Plan: General   Post-op Pain Management:    Induction: Intravenous  PONV Risk Score and Plan: 3 and Ondansetron, Dexamethasone and Midazolam  Airway Management Planned: Oral ETT  Additional Equipment:   Intra-op Plan:   Post-operative Plan: Extubation in OR  Informed Consent: I have reviewed the patients History and Physical, chart, labs and discussed the procedure including the risks, benefits and alternatives for the proposed anesthesia with the patient or authorized representative who has indicated his/her understanding and acceptance.   Dental advisory given  Plan Discussed with: CRNA and Anesthesiologist  Anesthesia Plan Comments:        Anesthesia Quick Evaluation

## 2018-04-07 NOTE — Anesthesia Procedure Notes (Signed)
Procedure Name: Intubation Date/Time: 04/07/2018 10:16 AM Performed by: Colin Benton, CRNA Pre-anesthesia Checklist: Patient identified, Emergency Drugs available, Suction available and Patient being monitored Patient Re-evaluated:Patient Re-evaluated prior to induction Oxygen Delivery Method: Circle System Utilized Preoxygenation: Pre-oxygenation with 100% oxygen Induction Type: IV induction Ventilation: Mask ventilation without difficulty Laryngoscope Size: Mac and 3 Grade View: Grade I Tube type: Oral Tube size: 7.0 mm Number of attempts: 1 Airway Equipment and Method: Stylet Placement Confirmation: ETT inserted through vocal cords under direct vision,  positive ETCO2 and breath sounds checked- equal and bilateral Secured at: 22 cm Tube secured with: Tape Dental Injury: Teeth and Oropharynx as per pre-operative assessment  Comments: Intubation performed by Norm Salt, SRNA.

## 2018-04-07 NOTE — Progress Notes (Signed)
Day of Surgery Procedure(s) (LRB): TOTAL LAPAROSCOPIC HYSTERECTOMY WITH SALPINGECTOMY (Bilateral) CYSTOSCOPY (N/A)  Subjective: Patient reports doing well, tolerating liquids, about to eat something, pain well controlled, voiding well  Objective: I have reviewed patient's vital signs, intake and output and labs.  General: alert, cooperative and no distress Resp: clear to auscultation bilaterally Cardio: S1, S2 normal GI: incision: clean, dry and intact and soft, not tender to gentle palpation, +BS Extremities: extremities normal, atraumatic, no cyanosis or edema  Assessment: s/p Procedure(s): TOTAL LAPAROSCOPIC HYSTERECTOMY WITH SALPINGECTOMY (Bilateral) CYSTOSCOPY (N/A): stable and progressing well  Plan: Advance diet Encourage ambulation  Anticipated d/c in the am  LOS: 0 days    Salvadore Dom 04/07/2018, 6:39 PM

## 2018-04-08 ENCOUNTER — Encounter (HOSPITAL_COMMUNITY): Payer: Self-pay | Admitting: Obstetrics and Gynecology

## 2018-04-08 ENCOUNTER — Other Ambulatory Visit: Payer: Self-pay

## 2018-04-08 DIAGNOSIS — N888 Other specified noninflammatory disorders of cervix uteri: Secondary | ICD-10-CM | POA: Diagnosis not present

## 2018-04-08 DIAGNOSIS — G5601 Carpal tunnel syndrome, right upper limb: Secondary | ICD-10-CM | POA: Diagnosis not present

## 2018-04-08 DIAGNOSIS — D509 Iron deficiency anemia, unspecified: Secondary | ICD-10-CM | POA: Diagnosis not present

## 2018-04-08 DIAGNOSIS — Z79899 Other long term (current) drug therapy: Secondary | ICD-10-CM | POA: Diagnosis not present

## 2018-04-08 DIAGNOSIS — Z885 Allergy status to narcotic agent status: Secondary | ICD-10-CM | POA: Diagnosis not present

## 2018-04-08 DIAGNOSIS — N921 Excessive and frequent menstruation with irregular cycle: Secondary | ICD-10-CM | POA: Diagnosis not present

## 2018-04-08 DIAGNOSIS — N838 Other noninflammatory disorders of ovary, fallopian tube and broad ligament: Secondary | ICD-10-CM | POA: Diagnosis not present

## 2018-04-08 DIAGNOSIS — Z888 Allergy status to other drugs, medicaments and biological substances status: Secondary | ICD-10-CM | POA: Diagnosis not present

## 2018-04-08 DIAGNOSIS — N800 Endometriosis of uterus: Secondary | ICD-10-CM | POA: Diagnosis not present

## 2018-04-08 DIAGNOSIS — Z882 Allergy status to sulfonamides status: Secondary | ICD-10-CM | POA: Diagnosis not present

## 2018-04-08 DIAGNOSIS — I471 Supraventricular tachycardia: Secondary | ICD-10-CM | POA: Diagnosis not present

## 2018-04-08 DIAGNOSIS — Z793 Long term (current) use of hormonal contraceptives: Secondary | ICD-10-CM | POA: Diagnosis not present

## 2018-04-08 DIAGNOSIS — N840 Polyp of corpus uteri: Secondary | ICD-10-CM | POA: Diagnosis not present

## 2018-04-08 DIAGNOSIS — F411 Generalized anxiety disorder: Secondary | ICD-10-CM | POA: Diagnosis not present

## 2018-04-08 DIAGNOSIS — R011 Cardiac murmur, unspecified: Secondary | ICD-10-CM | POA: Diagnosis not present

## 2018-04-08 DIAGNOSIS — D259 Leiomyoma of uterus, unspecified: Secondary | ICD-10-CM | POA: Diagnosis not present

## 2018-04-08 MED ORDER — DOCUSATE SODIUM 100 MG PO CAPS: 100.0000 mg | ORAL_CAPSULE | Freq: Two times a day (BID) | ORAL | 0 refills | Status: DC

## 2018-04-08 MED ORDER — ACETAMINOPHEN 325 MG PO TABS: 650.0000 mg | ORAL_TABLET | ORAL | 0 refills | Status: DC | PRN

## 2018-04-08 MED ORDER — TRAMADOL HCL 50 MG PO TABS: 50.0000 mg | ORAL_TABLET | Freq: Four times a day (QID) | ORAL | 0 refills | Status: DC | PRN

## 2018-04-08 MED ORDER — PROMETHAZINE HCL 12.5 MG PO TABS: ORAL_TABLET | ORAL | 0 refills | Status: DC

## 2018-04-08 NOTE — Discharge Instructions (Signed)
Post cardiac ablation procedure care instructions No driving for 4 days. No lifting over 5 lbs for 1 week. No vigorous or sexual activity for 1 week. You may return to work on 04/14/18. Keep procedure site clean & dry. If you notice increased pain, swelling, bleeding or pus, call/return!  You may shower, but no soaking baths/hot tubs/pools for 1 week.

## 2018-04-08 NOTE — Discharge Summary (Signed)
Physician Discharge Summary   Patient ID: Lindsey Adams [id number] 47 y.o. 1970-10-06  Admit date: 04/06/2018  Discharge date and time: 04/08/2018  Admitting Physician: Evans Lance, MD   Discharge Physician: Dr Evans Lance and Dr Sumner Boast  Admission Diagnoses: SVT (supraventricular tachycardia) ([place]) [I47.1], Fibroid uterus, Menometrorrhagia, iron deficient anemia  Discharge Diagnoses: Same s/p cardiac ablation on 04/06/18 and total laparoscopic hysterectomy on 04/07/18  Admission Condition: good  Discharged Condition: good  Indication for Admission: Cardiac ablation for svt and laparoscopic hysterectomy for abnormal uterine bleeding leading to anemia  Hospital Course: uncomplicated  Consults: None   Treatments: Cardiac ablation and total laparoscopic hysterectomy  I/O last 3 completed shifts: In: 3550.4 [P.O.:1060; I.V.:2490.4] Out: 4220 [Urine:4200; Blood:20] No intake/output data recorded.   Today's Vitals   04/08/18 0017 04/08/18 0510 04/08/18 0611 04/08/18 0742  BP:  131/77    Pulse:  73    Resp:  19    Temp:  98.4 F (36.9 C)    TempSrc:  Oral    SpO2:  100%    Weight:      Height:      PainSc: 4   4  4     Lab Results  Component Value Date   WBC 6.7 04/07/2018   HGB 10.3 (L) 04/07/2018   HCT 33.7 (L) 04/07/2018   MCV 90.1 04/07/2018   PLT 218 04/07/2018   Subjective: feeling well, pain controlled, tolerating po, ambulating, voiding. Ready to go home.   Discharge Exam: Heart: regular rate and rhythm Lungs: CTAB Abdomen: soft, non-tender; bowel sounds normal; mildly distended Incisions: clean, dry and intact without erythema Extremities: normal, atraumatic, no cyanosis, no edema or tenderness Skin: normal color, texture and turgor, no rashes or lesions   Disposition: Discharge disposition: 01-Home or Self Care       Patient Instructions:  Allergies as of 04/08/2018      Reactions   Sulfa Antibiotics Nausea Only   Adhesive  [tape] Other (See Comments)   Skin redness   Codeine Nausea Only   Hydrocodone Nausea Only      Medication List    STOP taking these medications   atenolol 25 MG tablet Commonly known as:  TENORMIN   medroxyPROGESTERone 10 MG tablet Commonly known as:  PROVERA     TAKE these medications   acetaminophen 325 MG tablet Commonly known as:  TYLENOL Take 2 tablets (650 mg total) by mouth every 4 (four) hours as needed for mild pain.   ALPRAZolam 1 MG tablet Commonly known as:  XANAX Take 1 mg by mouth 4 (four) times daily as needed for anxiety.   cyclobenzaprine 5 MG tablet Commonly known as:  FLEXERIL Take 1-2 tablets (5-10 mg total) by mouth 3 (three) times daily as needed for muscle spasms.   docusate sodium 100 MG capsule Commonly known as:  COLACE Take 1 capsule (100 mg total) by mouth 2 (two) times daily.   halobetasol 0.05 % ointment Commonly known as:  ULTRAVATE Apply 1 application topically 2 (two) times daily as needed (for outbreaks).   promethazine 12.5 MG tablet Commonly known as:  PHENERGAN Take 1-2 tablets po q 6 hours prn nausea   QUEtiapine 50 MG tablet Commonly known as:  SEROQUEL Take 50-150 mg by mouth at bedtime as needed (for sleep/maniac).   traMADol 50 MG tablet Commonly known as:  ULTRAM Take 1 tablet (50 mg total) by mouth every 6 (six) hours as needed for moderate pain.   valACYclovir 1000  MG tablet Commonly known as:  VALTREX Take 1/2 tablet daily for suppression, then 1/2 tablet twice daily for 3 days as needed for outbreaks What changed:    how much to take  how to take this  when to take this  reasons to take this      Activity: advace slowly as tolerated Diet: regular diet Wound Care: keep wound clean and dry  Follow-up with Dr Talbert Nan next week and Dr Lovena Le next month   Signed: Salvadore Dom 04/08/2018 7:45 AM

## 2018-04-13 ENCOUNTER — Telehealth: Payer: Self-pay | Admitting: Obstetrics and Gynecology

## 2018-04-13 ENCOUNTER — Inpatient Hospital Stay (HOSPITAL_COMMUNITY): Admission: RE | Admit: 2018-04-13 | Payer: BLUE CROSS/BLUE SHIELD | Source: Ambulatory Visit

## 2018-04-13 ENCOUNTER — Ambulatory Visit: Payer: BLUE CROSS/BLUE SHIELD | Admitting: Obstetrics and Gynecology

## 2018-04-13 NOTE — Telephone Encounter (Signed)
Spoke with Dr. Talbert Nan Via phone.  Okay for patient to use Fleets enema PR x 1.  No straining for bowel movement.  Call to patient. She had a bowel movement after drinking miralax and warm apple juice.  Feels much improved. Will keep her appointment for 4 PM.  Will hold on fleets for now.  Encounter closed.

## 2018-04-13 NOTE — Telephone Encounter (Signed)
Spoke with patient. She states she has been "feeling great" other than "gas pains" that started on Saturday. She continues to eat and drink and reports feeling improved after having a bowel movement today.  Had two soft formed bowel movements. No diarrhea. No vomiting. Able to pass gas.  Does not have a driver and does not want to drive this afternoon. I did encourage her to keep her appointment this afternoon for evaluation but patient states she really feels like she just needs some sleep and she will feel much better.  Advised patient if she develops any recurrence of pain or has diarrhea to go to [facility] at [facility] or call our office. Pt verbalized understanding and will follow up as scheduled for tomorrow.   Encounter update to Dr. Talbert Nan and will close.

## 2018-04-13 NOTE — Telephone Encounter (Signed)
Patient says she is so constipated she can barely walk.

## 2018-04-13 NOTE — Telephone Encounter (Signed)
Spoke with patient at time of incoming call.  She is day 6 post op TLH with BS.  Using colace 100 mg po bid and Senna-S .  Has had one small hard bowel movement two days ago.  Has been trying to have a bowel movement since 0200 this morning.  Passing gas without issue. No nausea, vomiting. Eating and drinking well. No fevers.   Having some feelings of gas in her stomach, lower pelvic area. This is painful to her when it occurs..   Encouraged warm liquids and gentle movements.  Add Miralax once a day. Advised can stop stool softener when she has a normal bowel movement.  Discussed drinking warm fluids and using assistance (stool) in bathroom to lift pelvic to allow easy passage of stool when having bowel movement.   She will try Miralax today.  Advised to not use Fleets enema or bottle of mag citrate as patient states she has these items and wants to use them to have a bowel movement.   Advised will call her to move appointment with Dr. Talbert Nan. Pt agreeable.

## 2018-04-13 NOTE — Progress Notes (Deleted)
GYNECOLOGY  VISIT   HPI: 47 y.o.   Divorced White or Caucasian Not Hispanic or Latino  female   [phone] with No LMP recorded. (Menstrual status: Irregular Periods).   here for 1 week post op.   GYNECOLOGIC HISTORY: No LMP recorded. (Menstrual status: Irregular Periods). Contraception:*** Menopausal hormone therapy: ***        OB History    Gravida  4   Para  1   Term  0   Preterm  1   AB  2   Living  1     SAB  1   TAB  1   Ectopic      Multiple      Live Births  1              Patient Active Problem List   Diagnosis Date Noted  . Status post laparoscopic hysterectomy 04/07/2018  . IDA (iron deficiency anemia) 03/24/2018  . 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 SVT (supraventricular tachycardia) ([place]) 08/08/2014  . ADD (attention deficit disorder) 08/08/2014  . Generalized anxiety disorder 08/08/2014    Past Medical History:  Diagnosis Date  . Allergy   . Anemia   . Anxiety   . Dysmenorrhea   . Fibroid   . Heart murmur   . History of mania   . Hormone disorder   . STD (sexually transmitted disease)   . SVT (supraventricular tachycardia) ([place])   . Urinary incontinence     Past Surgical History:  Procedure Laterality Date  . APPENDECTOMY    . BREAST REDUCTION SURGERY Bilateral   . BREAST SURGERY    . CESAREAN SECTION    . COSMETIC SURGERY    . CYSTOSCOPY N/A 04/07/2018   Procedure: CYSTOSCOPY;  Surgeon: Salvadore Dom, MD;  Location: [facility];  Service: Gynecology;  Laterality: N/A;  . NASAL SINUS SURGERY    . SVT ABLATION N/A 04/06/2018   Procedure: SVT ABLATION;  Surgeon: Evans Lance, MD;  Location: [facility];  Service: Cardiovascular;  Laterality: N/A;  . TOTAL LAPAROSCOPIC HYSTERECTOMY WITH SALPINGECTOMY Bilateral 04/07/2018   Procedure: TOTAL LAPAROSCOPIC HYSTERECTOMY WITH SALPINGECTOMY;  Surgeon: Salvadore Dom, MD;  Location: [facility];  Service: Gynecology;  Laterality:  Bilateral;    Current Outpatient Medications  Medication Sig Dispense Refill  . acetaminophen (TYLENOL) 325 MG tablet Take 2 tablets (650 mg total) by mouth every 4 (four) hours as needed for mild pain. 30 tablet 0  . ALPRAZolam (XANAX) 1 MG tablet Take 1 mg by mouth 4 (four) times daily as needed for anxiety.   5  . cyclobenzaprine (FLEXERIL) 5 MG tablet Take 1-2 tablets (5-10 mg total) by mouth 3 (three) times daily as needed for muscle spasms. 60 tablet 1  . docusate sodium (COLACE) 100 MG capsule Take 1 capsule (100 mg total) by mouth 2 (two) times daily. 10 capsule 0  . halobetasol (ULTRAVATE) 0.05 % ointment Apply 1 application topically 2 (two) times daily as needed (for outbreaks).   2  . promethazine (PHENERGAN) 12.5 MG tablet Take 1-2 tablets po q 6 hours prn nausea 30 tablet 0  . QUEtiapine (SEROQUEL) 50 MG tablet Take 50-150 mg by mouth at bedtime as needed (for sleep/maniac).    . traMADol (ULTRAM) 50 MG tablet Take 1 tablet (50 mg total) by mouth every 6 (six) hours as needed for moderate pain. 28 tablet 0  . valACYclovir (VALTREX) 1000 MG tablet  Take 1/2 tablet daily for suppression, then 1/2 tablet twice daily for 3 days as needed for outbreaks (Patient taking differently: Take 500 mg by mouth daily as needed (for suppression/outbreaks.). Take 1/2 tablet daily for suppression, then 1/2 tablet twice daily for 3 days as needed for outbreaks) 30 tablet 12   No current facility-administered medications for this visit.      ALLERGIES: Sulfa antibiotics; Adhesive [tape]; Codeine; and Hydrocodone  Family History  Problem Relation Age of Onset  . Liver disease Mother   . Heart attack Maternal Grandfather   . Heart disease Maternal Grandfather     Social History   Socioeconomic History  . Marital status: Divorced    Spouse name: Not on file  . Number of children: 1  . Years of education: Not on file  . Highest education level: Not on file  Occupational History  . Not on  file  Social Needs  . Financial resource strain: Not on file  . Food insecurity:    Worry: Not on file    Inability: Not on file  . Transportation needs:    Medical: Not on file    Non-medical: Not on file  Tobacco Use  . Smoking status: Never Smoker  . Smokeless tobacco: Never Used  Substance and Sexual Activity  . Alcohol use: Yes    Alcohol/week: 4.0 - 6.0 standard drinks    Types: 4 - 6 Standard drinks or equivalent per week  . Drug use: No  . Sexual activity: Yes    Partners: Male    Birth control/protection: None  Lifestyle  . Physical activity:    Days per week: Not on file    Minutes per session: Not on file  . Stress: Not on file  Relationships  . Social connections:    Talks on phone: Not on file    Gets together: Not on file    Attends religious service: Not on file    Active member of club or organization: Not on file    Attends meetings of clubs or organizations: Not on file    Relationship status: Not on file  . Intimate partner violence:    Fear of current or ex partner: Not on file    Emotionally abused: Not on file    Physically abused: Not on file    Forced sexual activity: Not on file  Other Topics Concern  . 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    General appearance: alert, cooperative and appears stated age Neck: no adenopathy, supple, symmetrical, trachea midline and thyroid {CHL AMB PHY EX THYROID NORM DEFAULT:[phone]::"normal to inspection and palpation"} Breasts: {Exam; breast:13139::"normal appearance, no masses or tenderness"} Abdomen: soft, non-tender; non distended, no masses,  no organomegaly  Pelvic: External genitalia:  no lesions              Urethra:  normal appearing urethra with no masses, tenderness or lesions              Bartholins and Skenes: normal                 Vagina: normal appearing vagina  with normal color and discharge, no lesions              Cervix: {CHL AMB PHY EX CERVIX NORM  DEFAULT:[phone]::"no lesions"}              Bimanual Exam:  Uterus:  {CHL AMB PHY EX UTERUS NORM [phone]::"normal size, contour, position, consistency, mobility, non-tender"}              Adnexa: {CHL AMB PHY EX ADNEXA NO MASS [phone]::"no mass, fullness, tenderness"}              Rectovaginal: {yes no:[id number]}.  Confirms.              Anus:  normal sphincter tone, no lesions  Chaperone was present for exam.  ASSESSMENT     PLAN    An After Visit Summary was printed and given to the patient.  *** minutes face to face time of which over 50% was spent in counseling.

## 2018-04-13 NOTE — Telephone Encounter (Signed)
Patient called to reschedule her post op appointment this afternoon. She said she used the Miralax as instructed and "it is working and does not want to drive this afternoon". She rescheduled to tomorrow at 1:00 pm. No need to return call to patient unless you have questions.

## 2018-04-14 ENCOUNTER — Other Ambulatory Visit: Payer: Self-pay

## 2018-04-14 ENCOUNTER — Encounter: Payer: Self-pay | Admitting: Obstetrics and Gynecology

## 2018-04-14 ENCOUNTER — Ambulatory Visit (INDEPENDENT_AMBULATORY_CARE_PROVIDER_SITE_OTHER): Payer: BLUE CROSS/BLUE SHIELD | Admitting: Obstetrics and Gynecology

## 2018-04-14 VITALS — BP 112/80 | HR 80 | Wt 143.8 lb

## 2018-04-14 DIAGNOSIS — Z90710 Acquired absence of both cervix and uterus: Secondary | ICD-10-CM

## 2018-04-14 NOTE — Progress Notes (Signed)
GYNECOLOGY  VISIT   HPI: 47 y.o.   Divorced White or Caucasian Not Hispanic or Latino  female   [phone] with Patient's last menstrual period was 07/26/2014.   here for 1 week post op S/P TLH/BS/Cystoscopy. She is doing well.  She had bad constipation, finally had multiple BM's yesterday with miralax. Voiding fine. Not in significant pain. She has been running since her surgery, cooked Thanksgiving diner. Feeling good, driving.   GYNECOLOGIC HISTORY: Patient's last menstrual period was 07/26/2014. Contraception: Hysterectomy Menopausal hormone therapy: None        OB History    Gravida  4   Para  1   Term  0   Preterm  1   AB  2   Living  1     SAB  1   TAB  1   Ectopic      Multiple      Live Births  1              Patient Active Problem List   Diagnosis Date Noted  . Status post laparoscopic hysterectomy 04/07/2018  . IDA (iron deficiency anemia) 03/24/2018  . 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 SVT (supraventricular tachycardia) ([place]) 08/08/2014  . ADD (attention deficit disorder) 08/08/2014  . Generalized anxiety disorder 08/08/2014    Past Medical History:  Diagnosis Date  . Allergy   . Anemia   . Anxiety   . Dysmenorrhea   . Fibroid   . Heart murmur   . History of mania   . Hormone disorder   . STD (sexually transmitted disease)   . SVT (supraventricular tachycardia) ([place])   . Urinary incontinence     Past Surgical History:  Procedure Laterality Date  . APPENDECTOMY    . BREAST REDUCTION SURGERY Bilateral   . BREAST SURGERY    . CESAREAN SECTION    . COSMETIC SURGERY    . CYSTOSCOPY N/A 04/07/2018   Procedure: CYSTOSCOPY;  Surgeon: Salvadore Dom, MD;  Location: [facility];  Service: Gynecology;  Laterality: N/A;  . NASAL SINUS SURGERY    . SVT ABLATION N/A 04/06/2018   Procedure: SVT ABLATION;  Surgeon: Evans Lance, MD;  Location: [facility];  Service: Cardiovascular;   Laterality: N/A;  . TOTAL LAPAROSCOPIC HYSTERECTOMY WITH SALPINGECTOMY Bilateral 04/07/2018   Procedure: TOTAL LAPAROSCOPIC HYSTERECTOMY WITH SALPINGECTOMY;  Surgeon: Salvadore Dom, MD;  Location: [facility];  Service: Gynecology;  Laterality: Bilateral;    Current Outpatient Medications  Medication Sig Dispense Refill  . ALPRAZolam (XANAX) 1 MG tablet Take 1 mg by mouth 4 (four) times daily as needed for anxiety.   5  . cyclobenzaprine (FLEXERIL) 5 MG tablet Take 1-2 tablets (5-10 mg total) by mouth 3 (three) times daily as needed for muscle spasms. 60 tablet 1  . docusate sodium (COLACE) 100 MG capsule Take 1 capsule (100 mg total) by mouth 2 (two) times daily. 10 capsule 0  . halobetasol (ULTRAVATE) 0.05 % ointment Apply 1 application topically 2 (two) times daily as needed (for outbreaks).   2  . promethazine (PHENERGAN) 12.5 MG tablet Take 1-2 tablets po q 6 hours prn nausea 30 tablet 0  . sennosides-docusate sodium (SENOKOT-S) 8.6-50 MG tablet Take 1 tablet by mouth daily.    . traMADol (ULTRAM) 50 MG tablet Take 1 tablet (50 mg total) by mouth every 6 (six) hours as needed for moderate pain. 28 tablet 0  .  valACYclovir (VALTREX) 1000 MG tablet Take 1/2 tablet daily for suppression, then 1/2 tablet twice daily for 3 days as needed for outbreaks (Patient taking differently: Take 500 mg by mouth daily as needed (for suppression/outbreaks.). Take 1/2 tablet daily for suppression, then 1/2 tablet twice daily for 3 days as needed for outbreaks) 30 tablet 12  . acetaminophen (TYLENOL) 325 MG tablet Take 2 tablets (650 mg total) by mouth every 4 (four) hours as needed for mild pain. (Patient not taking: Reported on 04/14/2018) 30 tablet 0  . QUEtiapine (SEROQUEL) 50 MG tablet Take 50-150 mg by mouth at bedtime as needed (for sleep/maniac).     No current facility-administered medications for this visit.      ALLERGIES: Sulfa antibiotics; Adhesive [tape]; Codeine; and Hydrocodone  Family  History  Problem Relation Age of Onset  . Liver disease Mother   . Heart attack Maternal Grandfather   . Heart disease Maternal Grandfather     Social History   Socioeconomic History  . Marital status: Divorced    Spouse name: Not on file  . Number of children: 1  . Years of education: Not on file  . Highest education level: Not on file  Occupational History  . Not on file  Social Needs  . Financial resource strain: Not on file  . Food insecurity:    Worry: Not on file    Inability: Not on file  . Transportation needs:    Medical: Not on file    Non-medical: Not on file  Tobacco Use  . Smoking status: Never Smoker  . Smokeless tobacco: Never Used  Substance and Sexual Activity  . Alcohol use: Yes    Alcohol/week: 4.0 - 6.0 standard drinks    Types: 4 - 6 Standard drinks or equivalent per week  . Drug use: No  . Sexual activity: Yes    Partners: Male    Birth control/protection: None  Lifestyle  . Physical activity:    Days per week: Not on file    Minutes per session: Not on file  . Stress: Not on file  Relationships  . Social connections:    Talks on phone: Not on file    Gets together: Not on file    Attends religious service: Not on file    Active member of club or organization: Not on file    Attends meetings of clubs or organizations: Not on file    Relationship status: Not on file  . Intimate partner violence:    Fear of current or ex partner: Not on file    Emotionally abused: Not on file    Physically abused: Not on file    Forced sexual activity: Not on file  Other Topics Concern  . 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   HENT: Negative.   Eyes: Negative.   Respiratory: Negative.   Cardiovascular: Negative.   Gastrointestinal: Negative.   Genitourinary: Negative.   Musculoskeletal:       Hot flashes  Skin: Negative.   Neurological: Negative.    Endo/Heme/Allergies: Negative.   Psychiatric/Behavioral: Negative.     PHYSICAL EXAMINATION:    BP 112/80 (BP Location: Right Arm, Patient Position: Sitting, Cuff Size: Normal)   Pulse 80   Wt 143 lb 12.8 oz (65.2 kg)   LMP 07/26/2014   BMI 23.21 kg/m  General appearance: alert, cooperative and appears stated age Abdomen: soft, non-tender; non distended, no masses,  no organomegaly Incisions: healing well.     ASSESSMENT 1 week s/p TLH/BS/cystoscopy, doing well    PLAN F/U on 05/04/18 for post op check Will likely restart work the beginning of the new year   An After Visit Summary was printed and given to the patient.

## 2018-04-15 DIAGNOSIS — L308 Other specified dermatitis: Secondary | ICD-10-CM | POA: Diagnosis not present

## 2018-04-20 ENCOUNTER — Telehealth: Payer: Self-pay | Admitting: Obstetrics and Gynecology

## 2018-04-20 ENCOUNTER — Ambulatory Visit (INDEPENDENT_AMBULATORY_CARE_PROVIDER_SITE_OTHER): Payer: BLUE CROSS/BLUE SHIELD | Admitting: Obstetrics and Gynecology

## 2018-04-20 ENCOUNTER — Encounter: Payer: Self-pay | Admitting: Obstetrics and Gynecology

## 2018-04-20 ENCOUNTER — Other Ambulatory Visit: Payer: Self-pay

## 2018-04-20 VITALS — BP 118/82 | HR 76 | Wt 140.2 lb

## 2018-04-20 DIAGNOSIS — N760 Acute vaginitis: Secondary | ICD-10-CM

## 2018-04-20 DIAGNOSIS — R300 Dysuria: Secondary | ICD-10-CM

## 2018-04-20 DIAGNOSIS — R61 Generalized hyperhidrosis: Secondary | ICD-10-CM

## 2018-04-20 DIAGNOSIS — B9689 Other specified bacterial agents as the cause of diseases classified elsewhere: Secondary | ICD-10-CM | POA: Diagnosis not present

## 2018-04-20 LAB — POCT URINALYSIS DIPSTICK
Bilirubin, UA: NEGATIVE
Blood, UA: POSITIVE
Glucose, UA: NEGATIVE
Ketones, UA: NEGATIVE
Nitrite, UA: NEGATIVE
Protein, UA: NEGATIVE
Spec Grav, UA: 1.01 (ref 1.010–1.025)
Urobilinogen, UA: 0.2 E.U./dL
pH, UA: 7 (ref 5.0–8.0)

## 2018-04-20 MED ORDER — GABAPENTIN 100 MG PO CAPS: ORAL_CAPSULE | ORAL | 1 refills | Status: DC

## 2018-04-20 MED ORDER — METRONIDAZOLE 500 MG PO TABS: 500.0000 mg | ORAL_TABLET | Freq: Two times a day (BID) | ORAL | 0 refills | Status: DC

## 2018-04-20 NOTE — Telephone Encounter (Signed)
Patient recently had surgery and has been spotting.

## 2018-04-20 NOTE — Progress Notes (Signed)
GYNECOLOGY  VISIT   HPI: 47 y.o.   Divorced White or Caucasian Not Hispanic or Latino  female   [phone] with Patient's last menstrual period was 07/26/2014.  She is 2 weeks s/p TLH/BS. Here for vaginal discharge and spotting. Just noticed pink with wiping intermittently since the end of last week. This morning she noticed a little more pink d/c. She notices a slight odor, not sure if it from her vagina or urine. Intermittent mild dysuria, no frequency or urgency, thinks she is emptying. She is having some BM's, not quite back to normal. She has had small BM's for the last few days, no pain.  Also reports she is having night sweats that are waking her from her sleep. She was having some prior to surgery, these are worse. Has a lump in her right incision area that she would like looked at.   GYNECOLOGIC HISTORY: Patient's last menstrual period was 07/26/2014. Contraception:Hysterectomy Menopausal hormone therapy: None        OB History    Gravida  4   Para  1   Term  0   Preterm  1   AB  2   Living  1     SAB  1   TAB  1   Ectopic      Multiple      Live Births  1              Patient Active Problem List   Diagnosis Date Noted  . Status post laparoscopic hysterectomy 04/07/2018  . IDA (iron deficiency anemia) 03/24/2018  . 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 SVT (supraventricular tachycardia) ([place]) 08/08/2014  . ADD (attention deficit disorder) 08/08/2014  . Generalized anxiety disorder 08/08/2014    Past Medical History:  Diagnosis Date  . Allergy   . Anemia   . Anxiety   . Dysmenorrhea   . Fibroid   . Heart murmur   . History of mania   . Hormone disorder   . STD (sexually transmitted disease)   . SVT (supraventricular tachycardia) ([place])   . Urinary incontinence     Past Surgical History:  Procedure Laterality Date  . APPENDECTOMY    . BREAST REDUCTION SURGERY Bilateral   . BREAST SURGERY    . CESAREAN  SECTION    . COSMETIC SURGERY    . CYSTOSCOPY N/A 04/07/2018   Procedure: CYSTOSCOPY;  Surgeon: Salvadore Dom, MD;  Location: [facility];  Service: Gynecology;  Laterality: N/A;  . NASAL SINUS SURGERY    . SVT ABLATION N/A 04/06/2018   Procedure: SVT ABLATION;  Surgeon: Evans Lance, MD;  Location: [facility];  Service: Cardiovascular;  Laterality: N/A;  . TOTAL LAPAROSCOPIC HYSTERECTOMY WITH SALPINGECTOMY Bilateral 04/07/2018   Procedure: TOTAL LAPAROSCOPIC HYSTERECTOMY WITH SALPINGECTOMY;  Surgeon: Salvadore Dom, MD;  Location: [facility];  Service: Gynecology;  Laterality: Bilateral;    Current Outpatient Medications  Medication Sig Dispense Refill  . ALPRAZolam (XANAX) 1 MG tablet Take 1 mg by mouth 4 (four) times daily as needed for anxiety.   5  . cyclobenzaprine (FLEXERIL) 5 MG tablet Take 1-2 tablets (5-10 mg total) by mouth 3 (three) times daily as needed for muscle spasms. 60 tablet 1  . docusate sodium (COLACE) 100 MG capsule Take 1 capsule (100 mg total) by mouth 2 (two) times daily. 10 capsule 0  . halobetasol (ULTRAVATE) 0.05 % ointment Apply 1 application topically 2 (  two) times daily as needed (for outbreaks).   2  . QUEtiapine (SEROQUEL) 50 MG tablet Take 50-150 mg by mouth at bedtime as needed (for sleep/maniac).    Marland Kitchen sennosides-docusate sodium (SENOKOT-S) 8.6-50 MG tablet Take 1 tablet by mouth daily.    . valACYclovir (VALTREX) 1000 MG tablet Take 1/2 tablet daily for suppression, then 1/2 tablet twice daily for 3 days as needed for outbreaks (Patient taking differently: Take 500 mg by mouth daily as needed (for suppression/outbreaks.). Take 1/2 tablet daily for suppression, then 1/2 tablet twice daily for 3 days as needed for outbreaks) 30 tablet 12  . traMADol (ULTRAM) 50 MG tablet Take 1 tablet (50 mg total) by mouth every 6 (six) hours as needed for moderate pain. (Patient not taking: Reported on 04/20/2018) 28 tablet 0   No current facility-administered  medications for this visit.      ALLERGIES: Sulfa antibiotics; Adhesive [tape]; Codeine; and Hydrocodone  Family History  Problem Relation Age of Onset  . Liver disease Mother   . Heart attack Maternal Grandfather   . Heart disease Maternal Grandfather     Social History   Socioeconomic History  . Marital status: Divorced    Spouse name: Not on file  . Number of children: 1  . Years of education: Not on file  . Highest education level: Not on file  Occupational History  . Not on file  Social Needs  . Financial resource strain: Not on file  . Food insecurity:    Worry: Not on file    Inability: Not on file  . Transportation needs:    Medical: Not on file    Non-medical: Not on file  Tobacco Use  . Smoking status: Never Smoker  . Smokeless tobacco: Never Used  Substance and Sexual Activity  . Alcohol use: Yes    Alcohol/week: 4.0 - 6.0 standard drinks    Types: 4 - 6 Standard drinks or equivalent per week  . Drug use: No  . Sexual activity: Yes    Partners: Male    Birth control/protection: None  Lifestyle  . Physical activity:    Days per week: Not on file    Minutes per session: Not on file  . Stress: Not on file  Relationships  . Social connections:    Talks on phone: Not on file    Gets together: Not on file    Attends religious service: Not on file    Active member of club or organization: Not on file    Attends meetings of clubs or organizations: Not on file    Relationship status: Not on file  . Intimate partner violence:    Fear of current or ex partner: Not on file    Emotionally abused: Not on file    Physically abused: Not on file    Forced sexual activity: Not on file  Other Topics Concern  . 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   HENT: Negative.   Eyes: Negative.   Respiratory: Negative.   Cardiovascular: Negative.   Gastrointestinal:        Lump in abdominal incision  Genitourinary:       Vaginal spotting Vaginal discharge Odor to urine  Skin: Negative.   Neurological: Negative.   Endo/Heme/Allergies: Negative.   Psychiatric/Behavioral: Negative.     PHYSICAL EXAMINATION:  BP 118/82 (BP Location: Right Arm, Patient Position: Sitting, Cuff Size: Normal)   Pulse 76   Wt 140 lb 3.2 oz (63.6 kg)   LMP 07/26/2014   BMI 22.63 kg/m     General appearance: alert, cooperative and appears stated age Abdomen: soft, non-tender; non distended, no masses,  no organomegaly Incisions: healing well, not tender, no erythema.   Pelvic: External genitalia:  no lesions              Urethra:  normal appearing urethra with no masses, tenderness or lesions              Bartholins and Skenes: normal                 Vagina: slightly atrophic appearing vagina with a slight increase in watery/brown vaginal d/c  Vaginal cuff intact and not tender              Cervix: absent              Bimanual Exam:  Uterus:  uterus absent              Adnexa: no mass, fullness, tenderness                Chaperone was present for exam.  ASSESSMENT Vaginal d/c, BV on slides Night sweats worse since surgery Intermittent dysuria    PLAN Treat with flagyl Send urine for ua, c&s Start gabapentin for night sweats   An After Visit Summary was printed and given to the patient.

## 2018-04-20 NOTE — Telephone Encounter (Signed)
Call to patient. Patient states that she started spotting on Friday night, has noticed it "here and there" over the week. States it is light pink. Not having to wear a panty liner/pad. No pain. Denies fever. Patient also states about the same time she started spotting, she started sweating perfusely. States the sweating is keeping her up at night. States there are 2 people in her household who have had the flu, but patient hasn't checked her temperature. Says, "I don't have a fever." States she has felt great otherwise. Has been driving, grocery shopping, etc. RN advised OV recommended for further evaluation. Reviewed Dr. Gentry Fitz schedule with Verline Lema, RN. Patient scheduled for today at 1315. Patient aware will be a work in appointment and is agreeable to date and time of appointment.   Routing to provider and will close encounter.

## 2018-04-21 LAB — URINALYSIS, MICROSCOPIC ONLY: Casts: NONE SEEN /lpf

## 2018-04-21 LAB — URINE CULTURE

## 2018-04-28 ENCOUNTER — Inpatient Hospital Stay: Payer: BLUE CROSS/BLUE SHIELD | Admitting: Family

## 2018-04-28 ENCOUNTER — Inpatient Hospital Stay: Payer: BLUE CROSS/BLUE SHIELD

## 2018-04-30 NOTE — Progress Notes (Signed)
GYNECOLOGY  VISIT   HPI: 47 y.o.   Divorced White or Caucasian Not Hispanic or Latino  female   [phone] with Patient's last menstrual period was 07/26/2014.here for 4 week post op s/p TLH/BS. She is doing great. Normal bowel and bladder function. No bleeding. She is having night sweats and hot flashes. Joints are hurting.   GYNECOLOGIC HISTORY: Patient's last menstrual period was 07/26/2014. Contraception: Hysterectomy Menopausal hormone therapy: None        OB History    Gravida  4   Para  1   Term  0   Preterm  1   AB  2   Living  1     SAB  1   TAB  1   Ectopic      Multiple      Live Births  1              Patient Active Problem List   Diagnosis Date Noted  . Status post laparoscopic hysterectomy 04/07/2018  . IDA (iron deficiency anemia) 03/24/2018  . 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 SVT (supraventricular tachycardia) ([place]) 08/08/2014  . ADD (attention deficit disorder) 08/08/2014  . Generalized anxiety disorder 08/08/2014    Past Medical History:  Diagnosis Date  . Allergy   . Anemia   . Anxiety   . Dysmenorrhea   . Fibroid   . Heart murmur   . History of mania   . Hormone disorder   . STD (sexually transmitted disease)   . SVT (supraventricular tachycardia) ([place])   . Urinary incontinence     Past Surgical History:  Procedure Laterality Date  . APPENDECTOMY    . BREAST REDUCTION SURGERY Bilateral   . BREAST SURGERY    . CESAREAN SECTION    . COSMETIC SURGERY    . CYSTOSCOPY N/A 04/07/2018   Procedure: CYSTOSCOPY;  Surgeon: Salvadore Dom, MD;  Location: [facility];  Service: Gynecology;  Laterality: N/A;  . NASAL SINUS SURGERY    . SVT ABLATION N/A 04/06/2018   Procedure: SVT ABLATION;  Surgeon: Evans Lance, MD;  Location: [facility];  Service: Cardiovascular;  Laterality: N/A;  . TOTAL LAPAROSCOPIC HYSTERECTOMY WITH SALPINGECTOMY Bilateral 04/07/2018   Procedure: TOTAL  LAPAROSCOPIC HYSTERECTOMY WITH SALPINGECTOMY;  Surgeon: Salvadore Dom, MD;  Location: [facility];  Service: Gynecology;  Laterality: Bilateral;    Current Outpatient Medications  Medication Sig Dispense Refill  . ALPRAZolam (XANAX) 1 MG tablet Take 1 mg by mouth 4 (four) times daily as needed for anxiety.   5  . cyclobenzaprine (FLEXERIL) 5 MG tablet Take 1-2 tablets (5-10 mg total) by mouth 3 (three) times daily as needed for muscle spasms. 60 tablet 1  . gabapentin (NEURONTIN) 100 MG capsule Gabapentin 100 mg po qhs, can increase to 2 tablets after 3 days and 3 tablets after another 3 days. 90 capsule 1  . halobetasol (ULTRAVATE) 0.05 % ointment Apply 1 application topically 2 (two) times daily as needed (for outbreaks).   2  . QUEtiapine (SEROQUEL) 50 MG tablet Take 50-150 mg by mouth at bedtime as needed (for sleep/maniac).    . valACYclovir (VALTREX) 1000 MG tablet Take 1/2 tablet daily for suppression, then 1/2 tablet twice daily for 3 days as needed for outbreaks (Patient taking differently: Take 500 mg by mouth daily as needed (for suppression/outbreaks.). Take 1/2 tablet daily for suppression, then 1/2 tablet twice daily for 3 days as  needed for outbreaks) 30 tablet 12   No current facility-administered medications for this visit.      ALLERGIES: Sulfa antibiotics; Adhesive [tape]; Codeine; and Hydrocodone  Family History  Problem Relation Age of Onset  . Liver disease Mother   . Heart attack Maternal Grandfather   . Heart disease Maternal Grandfather     Social History   Socioeconomic History  . Marital status: Divorced    Spouse name: Not on file  . Number of children: 1  . Years of education: Not on file  . Highest education level: Not on file  Occupational History  . Not on file  Social Needs  . Financial resource strain: Not on file  . Food insecurity:    Worry: Not on file    Inability: Not on file  . Transportation needs:    Medical: Not on file     Non-medical: Not on file  Tobacco Use  . Smoking status: Never Smoker  . Smokeless tobacco: Never Used  Substance and Sexual Activity  . Alcohol use: Yes    Alcohol/week: 4.0 - 6.0 standard drinks    Types: 4 - 6 Standard drinks or equivalent per week  . Drug use: No  . Sexual activity: Yes    Partners: Male    Birth control/protection: None  Lifestyle  . Physical activity:    Days per week: Not on file    Minutes per session: Not on file  . Stress: Not on file  Relationships  . Social connections:    Talks on phone: Not on file    Gets together: Not on file    Attends religious service: Not on file    Active member of club or organization: Not on file    Attends meetings of clubs or organizations: Not on file    Relationship status: Not on file  . Intimate partner violence:    Fear of current or ex partner: Not on file    Emotionally abused: Not on file    Physically abused: Not on file    Forced sexual activity: Not on file  Other Topics Concern  . 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   HENT: Negative.   Eyes: Negative.   Respiratory: Negative.   Cardiovascular: Negative.   Gastrointestinal: Negative.   Genitourinary: Negative.   Musculoskeletal: Negative.   Skin: Negative.   Neurological: Negative.   Endo/Heme/Allergies: Negative.   Psychiatric/Behavioral: Negative.     PHYSICAL EXAMINATION:    BP 116/68 (BP Location: Right Arm, Patient Position: Sitting, Cuff Size: Normal)   Pulse 76   Resp 16   Ht 5\' 6"  (1.676 m)   Wt 141 lb (64 kg)   LMP 07/26/2014   BMI 22.76 kg/m     General appearance: alert, cooperative and appears stated age Abdomen: soft, non-tender; non distended, no masses,  no organomegaly Incisions: well healed  Pelvic: External genitalia:  no lesions              Urethra:  normal appearing urethra with no masses, tenderness or lesions               Bartholins and Skenes: normal                 Vagina: mildly atrophic appearing vagina with slight amount of yellow d/c  Cervix: absent              Bimanual Exam:  Uterus:  uterus absent              Adnexa: no mass, fullness, tenderness               Chaperone was present for exam.  ASSESSMENT S/P TLH Worsening vasomotor symptoms Recently treated for BV, mild odor c/o    PLAN Start low dose of vaginal estrogen for atrophy and healing Low dose estrogen patch for vasomotor symptoms, discussed risks, information given She can't be seen here after this month secondary to her insurance. I'm happy to help her primary manage her hormones as needed.  She should call with any concerns Affirm sent   An After Visit Summary was printed and given to the patient.

## 2018-05-01 ENCOUNTER — Encounter: Payer: Self-pay | Admitting: Family

## 2018-05-01 ENCOUNTER — Inpatient Hospital Stay (HOSPITAL_BASED_OUTPATIENT_CLINIC_OR_DEPARTMENT_OTHER): Payer: BLUE CROSS/BLUE SHIELD | Admitting: Family

## 2018-05-01 ENCOUNTER — Inpatient Hospital Stay: Payer: BLUE CROSS/BLUE SHIELD | Attending: Family

## 2018-05-01 VITALS — BP 129/84 | HR 98 | Temp 98.1°F | Resp 18 | Ht 66.0 in | Wt 138.1 lb

## 2018-05-01 DIAGNOSIS — N920 Excessive and frequent menstruation with regular cycle: Secondary | ICD-10-CM | POA: Insufficient documentation

## 2018-05-01 DIAGNOSIS — Z90711 Acquired absence of uterus with remaining cervical stump: Secondary | ICD-10-CM

## 2018-05-01 DIAGNOSIS — Z79899 Other long term (current) drug therapy: Secondary | ICD-10-CM

## 2018-05-01 DIAGNOSIS — D500 Iron deficiency anemia secondary to blood loss (chronic): Secondary | ICD-10-CM | POA: Diagnosis not present

## 2018-05-01 LAB — FERRITIN: Ferritin: 150 ng/mL (ref 11–307)

## 2018-05-01 LAB — IRON AND TIBC
Iron: 122 ug/dL (ref 41–142)
Saturation Ratios: 37 % (ref 21–57)
TIBC: 325 ug/dL (ref 236–444)
UIBC: 203 ug/dL (ref 120–384)

## 2018-05-01 LAB — CBC WITH DIFFERENTIAL (CANCER CENTER ONLY)
Abs Immature Granulocytes: 0.02 10*3/uL (ref 0.00–0.07)
Basophils Absolute: 0.1 10*3/uL (ref 0.0–0.1)
Basophils Relative: 2 %
Eosinophils Absolute: 0.7 10*3/uL — ABNORMAL HIGH (ref 0.0–0.5)
Eosinophils Relative: 11 %
HCT: 43.4 % (ref 36.0–46.0)
Hemoglobin: 13.7 g/dL (ref 12.0–15.0)
Immature Granulocytes: 0 %
Lymphocytes Relative: 43 %
Lymphs Abs: 2.7 10*3/uL (ref 0.7–4.0)
MCH: 29.9 pg (ref 26.0–34.0)
MCHC: 31.6 g/dL (ref 30.0–36.0)
MCV: 94.8 fL (ref 80.0–100.0)
Monocytes Absolute: 0.7 10*3/uL (ref 0.1–1.0)
Monocytes Relative: 10 %
Neutro Abs: 2.2 10*3/uL (ref 1.7–7.7)
Neutrophils Relative %: 34 %
Platelet Count: 353 10*3/uL (ref 150–400)
RBC: 4.58 MIL/uL (ref 3.87–5.11)
RDW: 18.8 % — ABNORMAL HIGH (ref 11.5–15.5)
WBC Count: 6.4 10*3/uL (ref 4.0–10.5)
nRBC: 0 % (ref 0.0–0.2)

## 2018-05-01 NOTE — Progress Notes (Signed)
Hematology and Oncology Follow Up Visit  Lindsey Adams [id number] 02-12-1971 47 y.o. 05/01/2018   Principle Diagnosis:  Iron deficiency anemia secondary to heavy cycles  Current Therapy:   Partial hysterectomy on 04/07/2018   Interim History:  Lindsey Adams is here today for follow-up. She is doing well and has no complaints at this time.  Her SVT ablation on 11/25 went well and she has not had any breakthrough SVT so far.  She had her partial hysterectomy on 11/26 and has recuperated nicely.  No episodes of bleeding, no bruising or petechiae.  Her symptoms with anemia have since resolved and Hgb is now up to 13.7 with an MCV of 94.  No fever, chills, n/v, cough, rash, dizziness, SOB, chest pain, palpitations, abdominal pain or changes in bowel or bladder habits.  No swelling, tenderness, numbness or tingling in her extremities.  No lymphadenopathy noted on exam.  She has maintained a good appetite and is staying well hydrated. Her weight is stable.   ECOG Performance Status: 1 - Symptomatic but completely ambulatory  Medications:  Allergies as of 05/01/2018      Reactions   Sulfa Antibiotics Nausea Only   Adhesive [tape] Other (See Comments)   Skin redness   Codeine Nausea Only   Hydrocodone Nausea Only      Medication List       Accurate as of May 01, 2018  8:49 AM. Always use your most recent med list.        ALPRAZolam 1 MG tablet Commonly known as:  XANAX Take 1 mg by mouth 4 (four) times daily as needed for anxiety.   cyclobenzaprine 5 MG tablet Commonly known as:  FLEXERIL Take 1-2 tablets (5-10 mg total) by mouth 3 (three) times daily as needed for muscle spasms.   gabapentin 100 MG capsule Commonly known as:  NEURONTIN Gabapentin 100 mg po qhs, can increase to 2 tablets after 3 days and 3 tablets after another 3 days.   halobetasol 0.05 % ointment Commonly known as:  ULTRAVATE Apply 1 application topically 2 (two) times daily as needed (for  outbreaks).   QUEtiapine 50 MG tablet Commonly known as:  SEROQUEL Take 50-150 mg by mouth at bedtime as needed (for sleep/maniac).   valACYclovir 1000 MG tablet Commonly known as:  VALTREX Take 1/2 tablet daily for suppression, then 1/2 tablet twice daily for 3 days as needed for outbreaks       Allergies:  Allergies  Allergen Reactions  . Sulfa Antibiotics Nausea Only  . Adhesive [Tape] Other (See Comments)    Skin redness  . Codeine Nausea Only  . Hydrocodone Nausea Only    Past Medical History, Surgical history, Social history, and Family History were reviewed and updated.  Review of Systems: All other 10 point review of systems is negative.   Physical Exam:  height is 5\' 6"  (1.676 m) and weight is 138 lb 1.9 oz (62.7 kg). Her oral temperature is 98.1 F (36.7 C). Her blood pressure is 129/84 and her pulse is 98. Her respiration is 18 and oxygen saturation is 100%.   Wt Readings from Last 3 Encounters:  05/01/18 138 lb 1.9 oz (62.7 kg)  04/20/18 140 lb 3.2 oz (63.6 kg)  04/14/18 143 lb 12.8 oz (65.2 kg)    Ocular: Sclerae unicteric, pupils equal, round and reactive to light Ear-nose-throat: Oropharynx clear, dentition fair Lymphatic: No cervical, supraclavicular or axillary adenopathy Lungs no rales or rhonchi, good excursion bilaterally Heart regular rate and rhythm,  no murmur appreciated Abd soft, nontender, positive bowel sounds, no liver or spleen tip palpated on exam, no fluid wave  MSK no focal spinal tenderness, no joint edema Neuro: non-focal, well-oriented, appropriate affect Breasts: Deferred   Lab Results  Component Value Date   WBC 6.4 05/01/2018   HGB 13.7 05/01/2018   HCT 43.4 05/01/2018   MCV 94.8 05/01/2018   PLT 353 05/01/2018   Lab Results  Component Value Date   FERRITIN <4 (L) 03/24/2018   IRON 20 (L) 03/24/2018   TIBC 510 (H) 03/24/2018   UIBC 490 (H) 03/24/2018   IRONPCTSAT 4 (L) 03/24/2018   Lab Results  Component Value Date     RETICCTPCT 1.4 03/24/2018   RBC 4.58 05/01/2018   No results found for: KPAFRELGTCHN, LAMBDASER, KAPLAMBRATIO No results found for: IGGSERUM, IGA, IGMSERUM No results found for: Ronnald Ramp, A1GS, A2GS, Violet Baldy, MSPIKE, SPEI   Chemistry      Component Value Date/Time   NA 140 04/03/2018 1218   K 4.0 04/03/2018 1218   CL 101 04/03/2018 1218   CO2 22 04/03/2018 1218   BUN 7 04/03/2018 1218   CREATININE 0.64 04/03/2018 1218   CREATININE 0.71 08/08/2014 1942      Component Value Date/Time   CALCIUM 9.6 04/03/2018 1218   ALKPHOS 56 06/10/2017 1331   AST 16 06/10/2017 1331   ALT 12 06/10/2017 1331   BILITOT <0.2 06/10/2017 1331       Impression and Plan: [name] is a very pleasant 47 yo caucasian female with iron deficiency anemia secondary to heavy cycles. She had a partial hysterectomy on 11/26 and her symptoms have since resolved. Her Hgb is now 13.7, MCV 94.  Iron studies are pending. We will bring her back in for infusion next week if needed.  We will see her in another 3 months.  She will contact our office with any questions or concerns. We can certainly see her sooner if need be.   Laverna Peace, NP 12/20/20198:49 AM

## 2018-05-04 ENCOUNTER — Ambulatory Visit (INDEPENDENT_AMBULATORY_CARE_PROVIDER_SITE_OTHER): Payer: BLUE CROSS/BLUE SHIELD | Admitting: Obstetrics and Gynecology

## 2018-05-04 ENCOUNTER — Encounter: Payer: Self-pay | Admitting: Obstetrics and Gynecology

## 2018-05-04 VITALS — BP 116/68 | HR 76 | Resp 16 | Ht 66.0 in | Wt 141.0 lb

## 2018-05-04 DIAGNOSIS — N898 Other specified noninflammatory disorders of vagina: Secondary | ICD-10-CM

## 2018-05-04 DIAGNOSIS — N951 Menopausal and female climacteric states: Secondary | ICD-10-CM

## 2018-05-04 DIAGNOSIS — Z90710 Acquired absence of both cervix and uterus: Secondary | ICD-10-CM

## 2018-05-04 MED ORDER — ESTRADIOL 0.025 MG/24HR TD PTTW: 1.0000 | MEDICATED_PATCH | TRANSDERMAL | 0 refills | Status: DC

## 2018-05-04 MED ORDER — ESTRADIOL 10 MCG VA TABS: 1.0000 | ORAL_TABLET | VAGINAL | 0 refills | Status: DC

## 2018-05-04 NOTE — Addendum Note (Signed)
Addended by: Gwendlyn Deutscher on: 05/04/2018 10:28 AM   Modules accepted: Orders

## 2018-05-04 NOTE — Patient Instructions (Signed)

## 2018-05-05 LAB — VAGINITIS/VAGINOSIS, DNA PROBE
Candida Species: NEGATIVE
Gardnerella vaginalis: NEGATIVE
Trichomonas vaginosis: NEGATIVE

## 2018-05-11 ENCOUNTER — Telehealth: Payer: Self-pay | Admitting: Obstetrics and Gynecology

## 2018-05-11 ENCOUNTER — Encounter: Payer: Self-pay | Admitting: Internal Medicine

## 2018-05-11 ENCOUNTER — Ambulatory Visit (INDEPENDENT_AMBULATORY_CARE_PROVIDER_SITE_OTHER): Payer: BLUE CROSS/BLUE SHIELD | Admitting: Internal Medicine

## 2018-05-11 VITALS — BP 124/70 | HR 88 | Ht 66.0 in | Wt 144.0 lb

## 2018-05-11 DIAGNOSIS — I471 Supraventricular tachycardia: Secondary | ICD-10-CM | POA: Diagnosis not present

## 2018-05-11 NOTE — Progress Notes (Signed)
HPI Lindsey Adams returns today after undergoing EPS/RFA of SVT which was followed by hysterectomy approx 1 month ago. In the interim she has done well with no chest pain or sob.  Allergies  Allergen Reactions  . Sulfa Antibiotics Nausea Only  . Adhesive [Tape] Other (See Comments)    Skin redness  . Codeine Nausea Only  . Hydrocodone Nausea Only     Current Outpatient Medications  Medication Sig Dispense Refill  . ALPRAZolam (XANAX) 1 MG tablet Take 1 mg by mouth 4 (four) times daily as needed for anxiety.   5  . cyclobenzaprine (FLEXERIL) 5 MG tablet Take 1-2 tablets (5-10 mg total) by mouth 3 (three) times daily as needed for muscle spasms. 60 tablet 1  . estradiol (VIVELLE-DOT) 0.025 MG/24HR Place 1 patch onto the skin 2 (two) times a week. 24 patch 0  . Estradiol 10 MCG TABS vaginal tablet Place 1 tablet (10 mcg total) vaginally 2 (two) times a week. 24 tablet 0  . gabapentin (NEURONTIN) 100 MG capsule Gabapentin 100 mg po qhs, can increase to 2 tablets after 3 days and 3 tablets after another 3 days. 90 capsule 1  . halobetasol (ULTRAVATE) 0.05 % ointment Apply 1 application topically 2 (two) times daily as needed (for outbreaks).   2  . QUEtiapine (SEROQUEL) 50 MG tablet Take 50-150 mg by mouth at bedtime as needed (for sleep/maniac).    . valACYclovir (VALTREX) 1000 MG tablet Take 1/2 tablet daily for suppression, then 1/2 tablet twice daily for 3 days as needed for outbreaks (Patient taking differently: Take 500 mg by mouth daily as needed (for suppression/outbreaks.). Take 1/2 tablet daily for suppression, then 1/2 tablet twice daily for 3 days as needed for outbreaks) 30 tablet 12   No current facility-administered medications for this visit.      Past Medical History:  Diagnosis Date  . Allergy   . Anemia   . Anxiety   . Dysmenorrhea   . Fibroid   . Heart murmur   . History of mania   . Hormone disorder   . STD (sexually transmitted disease)   . SVT  (supraventricular tachycardia) ([place])   . Urinary incontinence     ROS:   All systems reviewed and negative except as noted in the HPI.   Past Surgical History:  Procedure Laterality Date  . APPENDECTOMY    . BREAST REDUCTION SURGERY Bilateral   . BREAST SURGERY    . CESAREAN SECTION    . COSMETIC SURGERY    . CYSTOSCOPY N/A 04/07/2018   Procedure: CYSTOSCOPY;  Surgeon: Salvadore Dom, MD;  Location: [facility];  Service: Gynecology;  Laterality: N/A;  . NASAL SINUS SURGERY    . SVT ABLATION N/A 04/06/2018   Procedure: SVT ABLATION;  Surgeon: Evans Lance, MD;  Location: [facility];  Service: Cardiovascular;  Laterality: N/A;  . TOTAL LAPAROSCOPIC HYSTERECTOMY WITH SALPINGECTOMY Bilateral 04/07/2018   Procedure: TOTAL LAPAROSCOPIC HYSTERECTOMY WITH SALPINGECTOMY;  Surgeon: Salvadore Dom, MD;  Location: [facility];  Service: Gynecology;  Laterality: Bilateral;     Family History  Problem Relation Age of Onset  . Liver disease Mother   . Heart attack Maternal Grandfather   . Heart disease Maternal Grandfather      Social History   Socioeconomic History  . Marital status: Divorced    Spouse name: Not on file  . Number of children: 1  . Years of education: Not on file  .  Highest education level: Not on file  Occupational History  . Not on file  Social Needs  . Financial resource strain: Not on file  . Food insecurity:    Worry: Not on file    Inability: Not on file  . Transportation needs:    Medical: Not on file    Non-medical: Not on file  Tobacco Use  . Smoking status: Never Smoker  . Smokeless tobacco: Never Used  Substance and Sexual Activity  . Alcohol use: Yes    Alcohol/week: 4.0 - 6.0 standard drinks    Types: 4 - 6 Standard drinks or equivalent per week  . Drug use: No  . Sexual activity: Yes    Partners: Male    Birth control/protection: None  Lifestyle  . Physical activity:    Days per week: Not on file    Minutes per session: Not  on file  . Stress: Not on file  Relationships  . Social connections:    Talks on phone: Not on file    Gets together: Not on file    Attends religious service: Not on file    Active member of club or organization: Not on file    Attends meetings of clubs or organizations: Not on file    Relationship status: Not on file  . Intimate partner violence:    Fear of current or ex partner: Not on file    Emotionally abused: Not on file    Physically abused: Not on file    Forced sexual activity: Not on file  Other Topics Concern  . 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 SVT - she is doing well, s/p RFA. No change in meds. I discussed moderation of ETOH and caffeine. I will see her back as needed.   [name].D.

## 2018-05-11 NOTE — Patient Instructions (Addendum)
Medication Instructions:  Your physician recommends that you continue on your current medications as directed. Please refer to the Current Medication list given to you today.  Labwork: None ordered.  Testing/Procedures: None ordered.  Follow-Up: Your physician wants you to follow-up in: as needed.   Any Other Special Instructions Will Be Listed Below (If Applicable).  If you need a refill on your cardiac medications before your next appointment, please call your pharmacy.    

## 2018-05-11 NOTE — Telephone Encounter (Signed)
Spoke with patient. Calling to clarify that she is starting estradiol vaginal tab for vaginal atrophy and estradiol patch for vasomotor symptoms? Advised per Dr. Gentry Fitz OV notes dated 12/23, will start both medications as indicated. Instructions reviewed and questions answered.   Routing to provider for final review. Patient is agreeable to disposition. Will close encounter.

## 2018-05-11 NOTE — Telephone Encounter (Signed)
Patient called requesting to clarify prescription instructions for estrogen patch and estrogen cream. Advised patient will forward to Triage Nurse to review. Patient is agreeable  Routing to Triage Nurse

## 2018-05-19 ENCOUNTER — Telehealth: Payer: Self-pay | Admitting: Obstetrics and Gynecology

## 2018-05-19 ENCOUNTER — Encounter: Payer: Self-pay | Admitting: Emergency Medicine

## 2018-05-19 NOTE — Telephone Encounter (Signed)
1. Patient called requesting to speak with the nurse about returning to work.  2. Patient has questions about her estrogen patch and some possible side-effects she experienced.

## 2018-05-19 NOTE — Telephone Encounter (Signed)
Call to patient.  Mailbox full.  Unable to leave message.  Letter printed and signed by Dr. Talbert Nan. Can return to work with no restrictions.  Letter at front desk for her to pick up.  Mychart message sent with instructions.

## 2018-05-19 NOTE — Telephone Encounter (Signed)
I think she can have a note to return to work.  Congestion is a side effect listed with estrogen replacement therapy, but not a common complaint that I here. I think the vaginal estrogen is unlikely to cause systemic symptoms. I think she can retry the patch and see how she does. If she develops a rash, SOB, swelling of her mucouse membranes she needs to be seen right away(and take benadryl)

## 2018-05-19 NOTE — Telephone Encounter (Signed)
Patient called with two concerns.   Would like note to return to work. States she feels ready to return to work, emotionally and financially she is ready.  Needs a note to return.    Second issue: Tried vaginal estrogen and patch on Saturday. She states after about 6 hours she developed nasal congestion and throat irritation. She states this is same symptoms as when she was on oral birth control pills. States she is not SOB or having any chest pain or palpitations. Feels well. Removed the patch when she developed symptoms. Wants to replace the patch and wanted to know Dr. Albertina Senegal opinion on nasal congestion after applying estrogen patches.

## 2018-05-21 ENCOUNTER — Telehealth: Payer: Self-pay | Admitting: Obstetrics and Gynecology

## 2018-05-21 ENCOUNTER — Encounter: Payer: Self-pay | Admitting: Obstetrics and Gynecology

## 2018-05-21 NOTE — Telephone Encounter (Signed)
Patient states she has a question regarding yeast infection and would like prescription called in. Aware that she will probably need to be seen.

## 2018-05-21 NOTE — Telephone Encounter (Signed)
Spoke with patient advised of message from Dr. Talbert Nan and letter at front desk.  Verbalized understanding.  Will call back with any concerns.  Encounter closed.

## 2018-05-21 NOTE — Telephone Encounter (Signed)
Please let the patient know that it is hard to evaluate with the pictures. I don't think its from the estrogen. If she is already using a steroid ointment, she can add Vaseline.  She should make an appointment to see her primary or derm.

## 2018-05-21 NOTE — Telephone Encounter (Signed)
Call to patient. Patient states that she has these lesions that have been popping up all over her body. States she is being followed by a dermatologist and has had skin biopsies x2 at [facility] up started last week. Patient states she noticed a lesion on her right buttock yesterday. States she attached pictures to her MyChart message sent to Dr. Talbert Nan for her to look at. Wondering if the "red ring around it" could be yeast? States the lesion can be itchy, but that is likely due to the fact it's "staying wet." Denies drainage, but states it did bleed some yesterday. Patient also wondering if it could be a skin reaction to the Estrogen patch. States she put the patch on on Friday and took it off the same day. Has not replaced patch yet or used the tablets. OV recommended. Patient states her insurance has changed and she can't be seen at our office, that's why she sent the pictures for Dr.Jertson to look at. Pharmacy confirmed. RN advised would need to review with Dr. Talbert Nan and return call with any additional recommendations. Patient agreeable.   Routing to provider for review.

## 2018-05-21 NOTE — Telephone Encounter (Signed)
Patient sent the following correspondence through [facility]. Routing to triage to assist patient with request.  I called into you today concerned that this may be a yeast infection on my backside. I believe the lesion is like the ones that flare up all over my skin. I have not any intercourse or anything of that nature. This just flared up this bad yesterday. I have been showering with antibacterial soap and using  the halobetasol ointment. I don't know if it is a yeast infection, rash or what? How do I keep it  dry. I have been wearing a dress and no undergarmet. Since yesterday. I just called and sent word to see if you could call something in since my insurance has changed. I feel like my body is falling apart and just going crazy.  Could the be a reaction from the estrogen.    Help,    Lindsey Adams

## 2018-06-23 ENCOUNTER — Other Ambulatory Visit: Payer: Self-pay

## 2018-06-23 MED ORDER — ESTRADIOL 0.025 MG/24HR TD PTTW: 1.0000 | MEDICATED_PATCH | TRANSDERMAL | 2 refills | Status: DC

## 2018-06-23 NOTE — Telephone Encounter (Signed)
Medication refill request: estradiol 0.025  Last Office Visit: 05/04/18 Next AEX: nothing scheduled at this time.  Last MMG (if hormonal medication request): NA Refill authorized: #24 patches with 0 RF

## 2018-07-03 ENCOUNTER — Telehealth: Payer: Self-pay | Admitting: Obstetrics and Gynecology

## 2018-07-03 NOTE — Telephone Encounter (Signed)
Call to [facility] They stated that prior auth was initiated because patient requested too soon to refill.

## 2018-07-03 NOTE — Telephone Encounter (Signed)
Received call back from [facility], [employer].  Pt needs to have tried and failed brand Vagifem and Premarin Vaginal Cream before generic is covered.

## 2018-07-03 NOTE — Telephone Encounter (Signed)
Return call to [facility].  Left message to return my call.  Prior authorization denied for generic estradiol tablets.  [employer] requires Brand Vagifem tablets.  Lindsey Adams Key: [id number] - Rx #: [id number]

## 2018-07-03 NOTE — Telephone Encounter (Signed)
[employer] calling about the prior authorization for estradiol that was denied. [phone]

## 2018-07-08 ENCOUNTER — Telehealth: Payer: Self-pay | Admitting: Obstetrics and Gynecology

## 2018-07-08 NOTE — Telephone Encounter (Signed)
Call to pharmacy.  [employer] only covers brand vagifem.  Notified [facility].  Pt has a current rx.  They processed as brand and rx is covered for $10.00. She can pick it up tomorrow.  Call to patient. She is notified of process and coverage.  Will call back with any questions or concerns.  Encounter closed.

## 2018-07-08 NOTE — Telephone Encounter (Signed)
Patient is asking to change her Vagifem prescription to a generic. Patient confirmed pharmacy [place] , [place]

## 2018-07-30 ENCOUNTER — Telehealth: Payer: Self-pay | Admitting: Family

## 2018-07-30 NOTE — Telephone Encounter (Signed)
Unable to reach patient by phone to pre screen -COVID-19

## 2018-07-31 ENCOUNTER — Inpatient Hospital Stay: Payer: BLUE CROSS/BLUE SHIELD

## 2018-07-31 ENCOUNTER — Inpatient Hospital Stay: Payer: BLUE CROSS/BLUE SHIELD | Attending: Family | Admitting: Family

## 2018-08-31 ENCOUNTER — Other Ambulatory Visit: Payer: Self-pay

## 2018-08-31 NOTE — Telephone Encounter (Signed)
Medication refill request: Gabapentin Last OV:  05/04/18 JJ Next AEX: none scheduled Last MMG (if hormonal medication request): none Refill authorized: order pended #90 w/0 refills if authorized

## 2018-08-31 NOTE — Telephone Encounter (Signed)
Can you please check with the patient if she taking the gabapentin for night sweats? How much is she taking? How is it working. I'm happy to refill for the next 6 months.  If I remember correctly she can no longer come here secondary to insurance. She should have an annual exam in the fall either here or with her primary or a new GYN.

## 2018-09-01 NOTE — Telephone Encounter (Signed)
Spoke with patient. Currently taking gabapentin 300 mg qhs, states this is working well along with estradiol patch. Request to continue, is aware will need f/u in the fall, declines to schedule at this time.   Rx pended for Gabapentin 300 mg cap po qhs #90/1RF.   Routing to Dr. Talbert Nan.

## 2018-09-02 MED ORDER — GABAPENTIN 300 MG PO CAPS: 300.0000 mg | ORAL_CAPSULE | Freq: Every day | ORAL | 1 refills | Status: DC

## 2018-09-28 ENCOUNTER — Telehealth: Payer: Self-pay | Admitting: Obstetrics and Gynecology

## 2018-09-28 MED ORDER — ACYCLOVIR 5 % EX OINT: 1.0000 "application " | TOPICAL_OINTMENT | CUTANEOUS | 0 refills | Status: DC

## 2018-09-28 NOTE — Telephone Encounter (Signed)
Script sent, [employer] sent. Encounter closed.

## 2018-09-28 NOTE — Telephone Encounter (Signed)
Patient is asking for a new prescription for valacyovir ointment to [facility], [place], [place]. She states she has pills, but would like the ointment.

## 2018-09-28 NOTE — Telephone Encounter (Signed)
Spoke with patient. Requesting Rx for valacyclovir ointment to pharmacy on file for "fever blister on mouth". Has Rx for PO, is going to a wedding and would like to try ointment to clear this up. Patient states she has used ointment in the past. Advised I will review with Dr. Talbert Nan and return call with recommendations.   Dr. Talbert Nan -please advise on Rx.

## 2018-10-25 ENCOUNTER — Emergency Department (HOSPITAL_COMMUNITY): Payer: BLUE CROSS/BLUE SHIELD

## 2018-10-25 ENCOUNTER — Emergency Department (HOSPITAL_COMMUNITY)
Admission: EM | Admit: 2018-10-25 | Discharge: 2018-10-25 | Disposition: A | Discharge status: Home / Self Care | Payer: BLUE CROSS/BLUE SHIELD | Attending: Emergency Medicine | Admitting: Emergency Medicine

## 2018-10-25 ENCOUNTER — Encounter (HOSPITAL_COMMUNITY): Payer: Self-pay

## 2018-10-25 ENCOUNTER — Other Ambulatory Visit: Payer: Self-pay

## 2018-10-25 DIAGNOSIS — R1013 Epigastric pain: Secondary | ICD-10-CM | POA: Diagnosis not present

## 2018-10-25 DIAGNOSIS — R109 Unspecified abdominal pain: Secondary | ICD-10-CM | POA: Diagnosis not present

## 2018-10-25 DIAGNOSIS — R1084 Generalized abdominal pain: Secondary | ICD-10-CM | POA: Diagnosis not present

## 2018-10-25 DIAGNOSIS — R1011 Right upper quadrant pain: Secondary | ICD-10-CM | POA: Insufficient documentation

## 2018-10-25 DIAGNOSIS — K769 Liver disease, unspecified: Secondary | ICD-10-CM | POA: Diagnosis not present

## 2018-10-25 DIAGNOSIS — Z79899 Other long term (current) drug therapy: Secondary | ICD-10-CM | POA: Insufficient documentation

## 2018-10-25 DIAGNOSIS — R52 Pain, unspecified: Secondary | ICD-10-CM | POA: Diagnosis not present

## 2018-10-25 DIAGNOSIS — R197 Diarrhea, unspecified: Secondary | ICD-10-CM

## 2018-10-25 DIAGNOSIS — R079 Chest pain, unspecified: Secondary | ICD-10-CM | POA: Diagnosis not present

## 2018-10-25 LAB — COMPREHENSIVE METABOLIC PANEL
ALT: 20 U/L (ref 0–44)
AST: 36 U/L (ref 15–41)
Albumin: 4.5 g/dL (ref 3.5–5.0)
Alkaline Phosphatase: 61 U/L (ref 38–126)
Anion gap: 11 (ref 5–15)
BUN: 14 mg/dL (ref 6–20)
CO2: 20 mmol/L — ABNORMAL LOW (ref 22–32)
Calcium: 9.6 mg/dL (ref 8.9–10.3)
Chloride: 106 mmol/L (ref 98–111)
Creatinine, Ser: 0.7 mg/dL (ref 0.44–1.00)
GFR calc Af Amer: >60 mL/min (ref >60)
GFR calc non Af Amer: >60 mL/min (ref >60)
Glucose, Bld: 89 mg/dL (ref 70–99)
Potassium: 4.9 mmol/L (ref 3.5–5.1)
Sodium: 137 mmol/L (ref 135–145)
Total Bilirubin: 1.1 mg/dL (ref 0.3–1.2)
Total Protein: 8 g/dL (ref 6.5–8.1)

## 2018-10-25 LAB — CBC
HCT: 46 % (ref 36.0–46.0)
Hemoglobin: 16.1 g/dL — ABNORMAL HIGH (ref 12.0–15.0)
MCH: 35.2 pg — ABNORMAL HIGH (ref 26.0–34.0)
MCHC: 35 g/dL (ref 30.0–36.0)
MCV: 100.7 fL — ABNORMAL HIGH (ref 80.0–100.0)
Platelets: 231 10*3/uL (ref 150–400)
RBC: 4.57 MIL/uL (ref 3.87–5.11)
RDW: 11.2 % — ABNORMAL LOW (ref 11.5–15.5)
WBC: 11 10*3/uL — ABNORMAL HIGH (ref 4.0–10.5)
nRBC: 0 % (ref 0.0–0.2)

## 2018-10-25 LAB — URINALYSIS, ROUTINE W REFLEX MICROSCOPIC
Bilirubin Urine: NEGATIVE
Glucose, UA: NEGATIVE mg/dL
Hgb urine dipstick: NEGATIVE
Ketones, ur: NEGATIVE mg/dL
Leukocytes,Ua: NEGATIVE
Nitrite: NEGATIVE
Protein, ur: NEGATIVE mg/dL
Specific Gravity, Urine: 1.015 (ref 1.005–1.030)
pH: 9 — ABNORMAL HIGH (ref 5.0–8.0)

## 2018-10-25 LAB — LIPASE, BLOOD: Lipase: 33 U/L (ref 11–51)

## 2018-10-25 LAB — TROPONIN I: Troponin I: <0.03 ng/mL (ref <0.03)

## 2018-10-25 MED ORDER — SODIUM CHLORIDE 0.9 % IV BOLUS
1000.0000 mL | Freq: Once | INTRAVENOUS | Status: AC
  Administered 2018-10-25: 1000 mL via INTRAVENOUS

## 2018-10-25 MED ORDER — ONDANSETRON HCL 4 MG/2ML IJ SOLN: 4.0000 mg | Freq: Once | INTRAMUSCULAR | Status: DC

## 2018-10-25 MED ORDER — SODIUM CHLORIDE 0.9% FLUSH: 3.0000 mL | Freq: Once | INTRAVENOUS | Status: DC

## 2018-10-25 MED ORDER — DICYCLOMINE HCL 20 MG PO TABS: 20.0000 mg | ORAL_TABLET | Freq: Two times a day (BID) | ORAL | 0 refills | Status: AC | PRN

## 2018-10-25 NOTE — ED Notes (Signed)
Pt d/c home per MD order. Discharge summary reviewed, pt verbalizes understanding. Reports son is discharge ride home from hospital.  ambulatory off unit, no s/s of distress.

## 2018-10-25 NOTE — ED Triage Notes (Signed)
She c/o sudden onset of upper abd. Pain, which persists.

## 2018-10-25 NOTE — ED Provider Notes (Signed)
Received in signout pending rest of laboratory work-up.  Ultrasound without acute findings.  Mild elevation in white blood cell count otherwise laboratory work-up is normal.  Patient states that her symptoms have now resolved.  She is had several loose bowel movements since being in the emergency department.  Abdomen is soft and nontender.  Suspect sharp nature of the symptoms likely due to bowel spasm/cramps associated with diarrheal illness.  Do not believe that further work-up is necessary at this time.  Give prescription for Bentyl and advised follow-up with gastroenterology should her symptoms persist.  Return precautions given.   Julianne Rice, MD 10/25/18 [phone]

## 2018-10-25 NOTE — ED Notes (Signed)
US at bedside

## 2018-10-25 NOTE — ED Provider Notes (Signed)
[facility] DEPT Provider Note   CSN: [id number] Arrival date & time: 10/25/18  1225    History   Chief Complaint Chief Complaint  Patient presents with  . Abdominal Pain    HPI Lindsey Adams is a 48 y.o. female with past medical history of anemia, SVT, fibroids presenting to emergency department today with chief complaint of abdominal pain is 1 day.  Patient states when she woke up this morning she had epigastric pain.  She describes the pain as sharp.  The pain does not radiate.  She states throughout the day the pain continued to get worse.  She rates it 8 out of 10 in severity.  She did not take anything for pain prior to arrival.  Patient states that at one point the pain was so severe she felt her body get stiff.  She reports associated nausea without emesis.  She has not taken anything for pain prior to arrival. Patient states she drinks a glass of wine in the evening and thinks she might of had a full bottle last night.  After arriving to the hospital patient has had 2 episodes of loose stool. She denies any fever, chills, chest pain, shortness of breath, back pain, urinary symptoms, melena. Abdominal surgical history include hysterectomy.  Past Medical History:  Diagnosis Date  . Allergy   . Anemia   . Anxiety   . Dysmenorrhea   . Fibroid   . Heart murmur   . History of mania   . Hormone disorder   . STD (sexually transmitted disease)   . SVT (supraventricular tachycardia) ([place])   . Urinary incontinence     Patient Active Problem List   Diagnosis Date Noted  . Status post laparoscopic hysterectomy 04/07/2018  . IDA (iron deficiency anemia) 03/24/2018  . Bipolar 1 disorder ([place]) 03/10/2018  . 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 Carpal tunnel syndrome of left wrist 06/30/2017  . 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 SVT (supraventricular tachycardia) ([place]) 08/08/2014  . ADD (attention deficit disorder) 08/08/2014  . Generalized  anxiety disorder 08/08/2014    Past Surgical History:  Procedure Laterality Date  . ABDOMINAL HYSTERECTOMY    . APPENDECTOMY    . BREAST REDUCTION SURGERY Bilateral   . BREAST SURGERY    . CESAREAN SECTION    . COSMETIC SURGERY    . CYSTOSCOPY N/A 04/07/2018   Procedure: CYSTOSCOPY;  Surgeon: Salvadore Dom, MD;  Location: [facility];  Service: Gynecology;  Laterality: N/A;  . NASAL SINUS SURGERY    . SVT ABLATION N/A 04/06/2018   Procedure: SVT ABLATION;  Surgeon: Evans Lance, MD;  Location: [facility];  Service: Cardiovascular;  Laterality: N/A;  . TOTAL LAPAROSCOPIC HYSTERECTOMY WITH SALPINGECTOMY Bilateral 04/07/2018   Procedure: TOTAL LAPAROSCOPIC HYSTERECTOMY WITH SALPINGECTOMY;  Surgeon: Salvadore Dom, MD;  Location: [facility];  Service: Gynecology;  Laterality: Bilateral;     OB History    Gravida  4   Para  1   Term  0   Preterm  1   AB  2   Living  1     SAB  1   TAB  1   Ectopic      Multiple      Live Births  1            Home Medications    Prior to Admission medications   Medication Sig Start Date End Date Taking? Authorizing Provider  ALPRAZolam Duanne Moron)  1 MG tablet Take 1 mg by mouth 4 (four) times daily as needed for anxiety.  10/11/17  Yes [provider]  amphetamine-dextroamphetamine (ADDERALL) 20 MG tablet Take 20 mg by mouth 3 (three) times daily. 09/09/18  Yes [provider]  cetirizine (ZYRTEC) 10 MG tablet Take 10 mg by mouth daily.  09/05/18  Yes [provider]  estradiol (VIVELLE-DOT) 0.025 MG/24HR Place 1 patch onto the skin 2 (two) times a week. 06/25/18  Yes Salvadore Dom, MD  Estradiol 10 MCG TABS vaginal tablet Place 1 tablet (10 mcg total) vaginally 2 (two) times a week. 05/04/18  Yes Salvadore Dom, MD  gabapentin (NEURONTIN) 300 MG capsule Take 1 capsule (300 mg total) by mouth at bedtime. Patient taking differently: Take 300 mg by mouth at bedtime as needed (sleep,  eczema flares).  09/02/18  Yes Salvadore Dom, MD  halobetasol (ULTRAVATE) 0.05 % ointment Apply 1 application topically 2 (two) times daily as needed (for outbreaks).  03/11/18  Yes [provider]  QUEtiapine (SEROQUEL) 50 MG tablet Take 50 mg by mouth at bedtime.    Yes [provider]  valACYclovir (VALTREX) 1000 MG tablet Take 1/2 tablet daily for suppression, then 1/2 tablet twice daily for 3 days as needed for outbreaks Patient taking differently: Take 500 mg by mouth daily as needed (for suppression/outbreaks.). Take 1/2 tablet daily for suppression, then 1/2 tablet twice daily for 3 days as needed for outbreaks 06/10/17  Yes Weber, Damaris Hippo, PA-C    Family History Family History  Problem Relation Age of Onset  . Liver disease Mother   . Heart attack Maternal Grandfather   . Heart disease Maternal Grandfather     Social History Social History   Tobacco Use  . Smoking status: Never Smoker  . Smokeless tobacco: Never Used  Substance Use Topics  . Alcohol use: Yes    Alcohol/week: 4.0 - 6.0 standard drinks    Types: 4 - 6 Standard drinks or equivalent per week  . Drug use: No     Allergies   Sulfa antibiotics, Adhesive [tape], Codeine, and Hydrocodone   Review of Systems Review of Systems  Constitutional: Negative for chills and fever.  HENT: Negative for congestion, ear discharge, ear pain, sinus pressure, sinus pain and sore throat.   Eyes: Negative for pain and redness.  Respiratory: Negative for cough and shortness of breath.   Cardiovascular: Negative for chest pain.  Gastrointestinal: Positive for abdominal pain, diarrhea and nausea. Negative for constipation and vomiting.  Genitourinary: Negative for dysuria and hematuria.  Musculoskeletal: Negative for back pain and neck pain.  Skin: Negative for wound.  Neurological: Negative for weakness, numbness and headaches.     Physical Exam Updated Vital Signs BP 130/86 (BP Location: Right  Arm)   Pulse 83   Temp 98.5 F (36.9 C) (Oral)   Resp 18   LMP 07/26/2014   SpO2 99%   Physical Exam Vitals signs and nursing note reviewed.  Constitutional:      General: She is not in acute distress.    Appearance: She is not ill-appearing.  HENT:     Head: Normocephalic and atraumatic.     Right Ear: Tympanic membrane and external ear normal.     Left Ear: Tympanic membrane and external ear normal.     Nose: Nose normal.     Mouth/Throat:     Mouth: Mucous membranes are moist.     Pharynx: Oropharynx is clear.  Eyes:  General: No scleral icterus.       Right eye: No discharge.        Left eye: No discharge.     Extraocular Movements: Extraocular movements intact.     Conjunctiva/sclera: Conjunctivae normal.     Pupils: Pupils are equal, round, and reactive to light.  Neck:     Musculoskeletal: Normal range of motion.     Vascular: No JVD.  Cardiovascular:     Rate and Rhythm: Normal rate and regular rhythm.     Pulses: Normal pulses.          Radial pulses are 2+ on the right side and 2+ on the left side.     Heart sounds: Normal heart sounds.  Pulmonary:     Comments: Lungs clear to auscultation in all fields. Symmetric chest rise. No wheezing, rales, or rhonchi. Chest:     Chest wall: No tenderness.  Abdominal:     General: Bowel sounds are normal.     Tenderness: There is abdominal tenderness in the right upper quadrant. There is no right CVA tenderness or left CVA tenderness. Negative signs include Murphy's sign.     Comments: Abdomen is soft, non-distended. No rigidity, no guarding. No peritoneal signs.  Musculoskeletal: Normal range of motion.  Skin:    General: Skin is warm and dry.     Capillary Refill: Capillary refill takes less than 2 seconds.  Neurological:     Mental Status: She is oriented to person, place, and time.     GCS: GCS eye subscore is 4. GCS verbal subscore is 5. GCS motor subscore is 6.     Comments: Fluent speech, no facial droop.   Psychiatric:        Behavior: Behavior normal.      ED Treatments / Results  Labs (all labs ordered are listed, but only abnormal results are displayed) Labs Reviewed  URINALYSIS, ROUTINE W REFLEX MICROSCOPIC - Abnormal; Notable for the following components:      Result Value   pH 9.0 (*)    All other components within normal limits  LIPASE, BLOOD  COMPREHENSIVE METABOLIC PANEL  CBC  TROPONIN I    EKG None  Radiology Dg Chest Portable 1 View  Result Date: 10/25/2018 CLINICAL DATA:  She c/o sudden onset of upper abd. pain, which persists. H/o heart murmur, SVT. Pt had SVT ablation on 04/06/18. cp EXAM: PORTABLE CHEST 1 VIEW COMPARISON:  04/07/2018 FINDINGS: Normal mediastinum and cardiac silhouette. Normal pulmonary vasculature. No evidence of effusion, infiltrate, or pneumothorax. No acute bony abnormality. IMPRESSION: Normal chest radiograph. Electronically Signed   By: Suzy Bouchard M.D.   On: 10/25/2018 15:14    Procedures Procedures (including critical care time)  Medications Ordered in ED Medications  sodium chloride flush (NS) 0.9 % injection 3 mL (has no administration in time range)  sodium chloride 0.9 % bolus 1,000 mL (1,000 mLs Intravenous New Bag/Given 10/25/18 1458)     Initial Impression / Assessment and Plan / ED Course  I have reviewed the triage vital signs and the nursing notes.  Pertinent labs & imaging results that were available during my care of the patient were reviewed by me and considered in my medical decision making (see chart for details).  48 yo female who presents with concern for epigastric abdominal pain. She is afebrile, well appearing. On exam she has RUQ pain.  DDx includes cardiac, cholecystitis, pancreatitis, SBO, gastritis.  EKG sinus rhythm, without ischemic changes. Troponin negative, doubt ACS.  Chest xray without acute findings. CBC with leukocytosis of 11. CMP unremarkable. Lipase within normal limits. RUQ [place] pending.  Patient  care transferred to Dr. Lita Mains at the end of my shift. Patient presentation, ED course, and plan of care discussed with review of all pertinent labs and imaging. Please see his note for further details regarding further ED course and disposition. [place] without findings and pain is controlled suspect pt will be discharged home with pcp follow up.   This note was prepared using Dragon voice recognition software and may include unintentional dictation errors due to the inherent limitations of voice recognition software.     Final Clinical Impressions(s) / ED Diagnoses   Final diagnoses:  RUQ pain    ED Discharge Orders    None       Flint Melter 10/25/18 2022    Fredia Sorrow, MD 10/26/18 [phone]

## 2018-12-14 DIAGNOSIS — R109 Unspecified abdominal pain: Secondary | ICD-10-CM | POA: Diagnosis not present

## 2018-12-14 DIAGNOSIS — I471 Supraventricular tachycardia: Secondary | ICD-10-CM | POA: Diagnosis not present

## 2018-12-14 DIAGNOSIS — F319 Bipolar disorder, unspecified: Secondary | ICD-10-CM | POA: Diagnosis not present

## 2018-12-24 DIAGNOSIS — N200 Calculus of kidney: Secondary | ICD-10-CM | POA: Diagnosis not present

## 2018-12-24 DIAGNOSIS — M899 Disorder of bone, unspecified: Secondary | ICD-10-CM | POA: Diagnosis not present

## 2018-12-24 DIAGNOSIS — R109 Unspecified abdominal pain: Secondary | ICD-10-CM | POA: Diagnosis not present

## 2018-12-27 ENCOUNTER — Other Ambulatory Visit: Payer: Self-pay

## 2018-12-27 DIAGNOSIS — S0003XA Contusion of scalp, initial encounter: Secondary | ICD-10-CM | POA: Diagnosis not present

## 2018-12-27 DIAGNOSIS — Z23 Encounter for immunization: Secondary | ICD-10-CM | POA: Insufficient documentation

## 2018-12-27 DIAGNOSIS — S0101XA Laceration without foreign body of scalp, initial encounter: Secondary | ICD-10-CM | POA: Diagnosis not present

## 2018-12-27 DIAGNOSIS — S63696A Other sprain of right little finger, initial encounter: Secondary | ICD-10-CM | POA: Insufficient documentation

## 2018-12-27 DIAGNOSIS — Y9389 Activity, other specified: Secondary | ICD-10-CM | POA: Diagnosis not present

## 2018-12-27 DIAGNOSIS — S93602A Unspecified sprain of left foot, initial encounter: Secondary | ICD-10-CM | POA: Diagnosis not present

## 2018-12-27 DIAGNOSIS — Y929 Unspecified place or not applicable: Secondary | ICD-10-CM | POA: Diagnosis not present

## 2018-12-27 DIAGNOSIS — S63616A Unspecified sprain of right little finger, initial encounter: Secondary | ICD-10-CM | POA: Diagnosis not present

## 2018-12-27 DIAGNOSIS — Y999 Unspecified external cause status: Secondary | ICD-10-CM | POA: Diagnosis not present

## 2018-12-27 DIAGNOSIS — S93692A Other sprain of left foot, initial encounter: Secondary | ICD-10-CM | POA: Insufficient documentation

## 2018-12-27 DIAGNOSIS — L089 Local infection of the skin and subcutaneous tissue, unspecified: Secondary | ICD-10-CM | POA: Diagnosis not present

## 2018-12-27 DIAGNOSIS — M79641 Pain in right hand: Secondary | ICD-10-CM | POA: Diagnosis not present

## 2018-12-27 DIAGNOSIS — S0990XA Unspecified injury of head, initial encounter: Secondary | ICD-10-CM | POA: Diagnosis not present

## 2018-12-27 DIAGNOSIS — S90512A Abrasion, left ankle, initial encounter: Secondary | ICD-10-CM | POA: Diagnosis not present

## 2018-12-27 DIAGNOSIS — S92152A Displaced avulsion fracture (chip fracture) of left talus, initial encounter for closed fracture: Secondary | ICD-10-CM | POA: Diagnosis not present

## 2018-12-28 ENCOUNTER — Emergency Department (HOSPITAL_COMMUNITY): Payer: BLUE CROSS/BLUE SHIELD

## 2018-12-28 ENCOUNTER — Encounter (HOSPITAL_COMMUNITY): Payer: Self-pay | Admitting: Obstetrics and Gynecology

## 2018-12-28 ENCOUNTER — Emergency Department (HOSPITAL_COMMUNITY)
Admission: EM | Admit: 2018-12-28 | Discharge: 2018-12-28 | Disposition: A | Discharge status: Home / Self Care | Payer: BLUE CROSS/BLUE SHIELD | Attending: Emergency Medicine | Admitting: Emergency Medicine

## 2018-12-28 DIAGNOSIS — M79641 Pain in right hand: Secondary | ICD-10-CM | POA: Diagnosis not present

## 2018-12-28 DIAGNOSIS — L089 Local infection of the skin and subcutaneous tissue, unspecified: Secondary | ICD-10-CM

## 2018-12-28 DIAGNOSIS — S92152A Displaced avulsion fracture (chip fracture) of left talus, initial encounter for closed fracture: Secondary | ICD-10-CM | POA: Diagnosis not present

## 2018-12-28 DIAGNOSIS — S0003XA Contusion of scalp, initial encounter: Secondary | ICD-10-CM | POA: Diagnosis not present

## 2018-12-28 DIAGNOSIS — S63616A Unspecified sprain of right little finger, initial encounter: Secondary | ICD-10-CM

## 2018-12-28 DIAGNOSIS — S0101XA Laceration without foreign body of scalp, initial encounter: Secondary | ICD-10-CM

## 2018-12-28 DIAGNOSIS — S93602A Unspecified sprain of left foot, initial encounter: Secondary | ICD-10-CM

## 2018-12-28 MED ORDER — LIDOCAINE-EPINEPHRINE (PF) 2 %-1:200000 IJ SOLN
10.0000 mL | Freq: Once | INTRAMUSCULAR | Status: AC
  Administered 2018-12-28: 10 mL
  Filled 2018-12-28: qty 10

## 2018-12-28 MED ORDER — TETANUS-DIPHTH-ACELL PERTUSSIS 5-2.5-18.5 LF-MCG/0.5 IM SUSP
0.5000 mL | Freq: Once | INTRAMUSCULAR | Status: AC
  Administered 2018-12-28: 03:00:00 0.5 mL via INTRAMUSCULAR
  Filled 2018-12-28: qty 0.5

## 2018-12-28 NOTE — Discharge Instructions (Addendum)
Keep the splint clean and dry.  Wear the ankle support for comfort to stabilize your ankle and foot.  You can take ibuprofen 600 mg plus acetaminophen 650 mg every 6 hours as needed for pain.  Elevate the injured parts, use ice packs for comfort and to reduce swelling.  You can use over-the-counter antibiotic ointment on the abrasions.  Keep the abrasions clean.  Return to the emergency department for any problems listed on the head injury sheet.  The staple needs to be removed in 1 week.  Call [facility] to have your right finger and your left ankle/foot rechecked in the next 1 to 2 weeks.

## 2018-12-28 NOTE — ED Notes (Signed)
Pt transported to radiology.

## 2018-12-28 NOTE — ED Notes (Signed)
Requested supplies at bedside 
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
## 2018-12-29 DIAGNOSIS — M79672 Pain in left foot: Secondary | ICD-10-CM | POA: Diagnosis not present

## 2018-12-29 DIAGNOSIS — S93602A Unspecified sprain of left foot, initial encounter: Secondary | ICD-10-CM | POA: Diagnosis not present

## 2019-01-19 DIAGNOSIS — L309 Dermatitis, unspecified: Secondary | ICD-10-CM | POA: Diagnosis not present

## 2019-01-19 DIAGNOSIS — Z4802 Encounter for removal of sutures: Secondary | ICD-10-CM | POA: Diagnosis not present

## 2019-01-19 DIAGNOSIS — S93602D Unspecified sprain of left foot, subsequent encounter: Secondary | ICD-10-CM | POA: Diagnosis not present

## 2019-01-19 DIAGNOSIS — R109 Unspecified abdominal pain: Secondary | ICD-10-CM | POA: Diagnosis not present

## 2019-01-19 DIAGNOSIS — R718 Other abnormality of red blood cells: Secondary | ICD-10-CM | POA: Diagnosis not present

## 2019-02-05 DIAGNOSIS — H524 Presbyopia: Secondary | ICD-10-CM | POA: Diagnosis not present

## 2019-02-05 DIAGNOSIS — H2513 Age-related nuclear cataract, bilateral: Secondary | ICD-10-CM | POA: Diagnosis not present

## 2019-02-05 DIAGNOSIS — H04123 Dry eye syndrome of bilateral lacrimal glands: Secondary | ICD-10-CM | POA: Diagnosis not present

## 2019-02-05 DIAGNOSIS — H25013 Cortical age-related cataract, bilateral: Secondary | ICD-10-CM | POA: Diagnosis not present

## 2019-02-05 DIAGNOSIS — H1045 Other chronic allergic conjunctivitis: Secondary | ICD-10-CM | POA: Diagnosis not present

## 2019-02-09 DIAGNOSIS — S93602D Unspecified sprain of left foot, subsequent encounter: Secondary | ICD-10-CM | POA: Diagnosis not present

## 2019-05-25 DIAGNOSIS — Z20828 Contact with and (suspected) exposure to other viral communicable diseases: Secondary | ICD-10-CM | POA: Diagnosis not present

## 2019-05-25 DIAGNOSIS — Z03818 Encounter for observation for suspected exposure to other biological agents ruled out: Secondary | ICD-10-CM | POA: Diagnosis not present

## 2019-06-01 DIAGNOSIS — F988 Other specified behavioral and emotional disorders with onset usually occurring in childhood and adolescence: Secondary | ICD-10-CM | POA: Diagnosis not present

## 2019-06-01 DIAGNOSIS — R21 Rash and other nonspecific skin eruption: Secondary | ICD-10-CM | POA: Diagnosis not present

## 2019-06-01 DIAGNOSIS — F319 Bipolar disorder, unspecified: Secondary | ICD-10-CM | POA: Diagnosis not present

## 2019-06-01 DIAGNOSIS — I471 Supraventricular tachycardia: Secondary | ICD-10-CM | POA: Diagnosis not present

## 2019-06-03 DIAGNOSIS — Z20822 Contact with and (suspected) exposure to covid-19: Secondary | ICD-10-CM | POA: Diagnosis not present

## 2019-07-29 ENCOUNTER — Ambulatory Visit: Payer: BLUE CROSS/BLUE SHIELD | Admitting: Obstetrics and Gynecology

## 2019-08-16 ENCOUNTER — Ambulatory Visit: Payer: BLUE CROSS/BLUE SHIELD | Admitting: Obstetrics and Gynecology

## 2019-08-24 NOTE — Progress Notes (Deleted)
49 y.o. [id number] Divorced White or Caucasian Not Hispanic or Latino female here for annual exam.      Patient's last menstrual period was 07/26/2014.          Sexually active: {yes no:314532}  The current method of family planning is {contraception:315051}.    Exercising: {yes no:314532}  {types:19826} Smoker:  {YES [id number]  Health Maintenance: Pap:  06/10/17 normal  History of abnormal Pap:  no MMG:  Never BMD:   Never  Colonoscopy: Never TDaP:  12/28/2018 Gardasil: N/A   reports that she has never smoked. She has never used smokeless tobacco. She reports current alcohol use of about 4.0 - 6.0 standard drinks of alcohol per week. She reports that she does not use drugs.  Past Medical History:  Diagnosis Date  . Allergy   . Anemia   . Anxiety   . Dysmenorrhea   . Fibroid   . Heart murmur   . History of mania   . Hormone disorder   . STD (sexually transmitted disease)   . SVT (supraventricular tachycardia) ([place])   . Urinary incontinence     Past Surgical History:  Procedure Laterality Date  . ABDOMINAL HYSTERECTOMY    . APPENDECTOMY    . BREAST REDUCTION SURGERY Bilateral   . BREAST SURGERY    . CESAREAN SECTION    . COSMETIC SURGERY    . CYSTOSCOPY N/A 04/07/2018   Procedure: CYSTOSCOPY;  Surgeon: Salvadore Dom, MD;  Location: [facility];  Service: Gynecology;  Laterality: N/A;  . NASAL SINUS SURGERY    . SVT ABLATION N/A 04/06/2018   Procedure: SVT ABLATION;  Surgeon: Evans Lance, MD;  Location: [facility];  Service: Cardiovascular;  Laterality: N/A;  . TOTAL LAPAROSCOPIC HYSTERECTOMY WITH SALPINGECTOMY Bilateral 04/07/2018   Procedure: TOTAL LAPAROSCOPIC HYSTERECTOMY WITH SALPINGECTOMY;  Surgeon: Salvadore Dom, MD;  Location: [facility];  Service: Gynecology;  Laterality: Bilateral;    Current Outpatient Medications  Medication Sig Dispense Refill  . ALPRAZolam (XANAX) 1 MG tablet Take 1 mg by mouth 4 (four) times daily as needed for anxiety.   5   . amphetamine-dextroamphetamine (ADDERALL) 20 MG tablet Take 20 mg by mouth 3 (three) times daily.    . cetirizine (ZYRTEC) 10 MG tablet Take 10 mg by mouth daily.     Marland Kitchen dicyclomine (BENTYL) 20 MG tablet Take 1 tablet (20 mg total) by mouth 2 (two) times daily as needed for spasms. 30 tablet 0  . estradiol (VIVELLE-DOT) 0.025 MG/24HR Place 1 patch onto the skin 2 (two) times a week. 24 patch 2  . Estradiol 10 MCG TABS vaginal tablet Place 1 tablet (10 mcg total) vaginally 2 (two) times a week. 24 tablet 0  . gabapentin (NEURONTIN) 300 MG capsule Take 1 capsule (300 mg total) by mouth at bedtime. (Patient taking differently: Take 300 mg by mouth at bedtime as needed (sleep, eczema flares). ) 90 capsule 1  . halobetasol (ULTRAVATE) 0.05 % ointment Apply 1 application topically 2 (two) times daily as needed (for outbreaks).   2  . QUEtiapine (SEROQUEL) 50 MG tablet Take 50 mg by mouth at bedtime.     . valACYclovir (VALTREX) 1000 MG tablet Take 1/2 tablet daily for suppression, then 1/2 tablet twice daily for 3 days as needed for outbreaks (Patient taking differently: Take 500 mg by mouth daily as needed (for suppression/outbreaks.). Take 1/2 tablet daily for suppression, then 1/2 tablet twice daily for 3 days as needed for outbreaks) 30  tablet 12   No current facility-administered medications for this visit.    Family History  Problem Relation Age of Onset  . Liver disease Mother   . Heart attack Maternal Grandfather   . Heart disease Maternal Grandfather     Review of Systems  Exam:   LMP 07/26/2014   Weight change: @WEIGHTCHANGE @ Height:      Ht Readings from Last 3 Encounters:  12/28/18 5\' 6"  (1.676 m)  05/11/18 5\' 6"  (1.676 m)  05/04/18 5\' 6"  (1.676 m)    General appearance: alert, cooperative and appears stated age Head: Normocephalic, without obvious abnormality, atraumatic Neck: no adenopathy, supple, symmetrical, trachea midline and thyroid {CHL AMB PHY EX THYROID NORM  DEFAULT:[phone]::"normal to inspection and palpation"} Lungs: clear to auscultation bilaterally Cardiovascular: regular rate and rhythm Breasts: {Exam; breast:13139::"normal appearance, no masses or tenderness"} Abdomen: soft, non-tender; non distended,  no masses,  no organomegaly Extremities: extremities normal, atraumatic, no cyanosis or edema Skin: Skin color, texture, turgor normal. No rashes or lesions Lymph nodes: Cervical, supraclavicular, and axillary nodes normal. No abnormal inguinal nodes palpated Neurologic: Grossly normal   Pelvic: External genitalia:  no lesions              Urethra:  normal appearing urethra with no masses, tenderness or lesions              Bartholins and Skenes: normal                 Vagina: normal appearing vagina with normal color and discharge, no lesions              Cervix: {CHL AMB PHY EX CERVIX NORM DEFAULT:[phone]::"no lesions"}               Bimanual Exam:  Uterus:  {CHL AMB PHY EX UTERUS NORM [phone]::"normal size, contour, position, consistency, mobility, non-tender"}              Adnexa: {CHL AMB PHY EX ADNEXA NO MASS [phone]::"no mass, fullness, tenderness"}               Rectovaginal: Confirms               Anus:  normal sphincter tone, no lesions  *** chaperoned for the exam.  A:  Well Woman with normal exam  P:

## 2019-08-30 ENCOUNTER — Ambulatory Visit: Payer: BLUE CROSS/BLUE SHIELD | Admitting: Obstetrics and Gynecology

## 2019-09-02 ENCOUNTER — Ambulatory Visit: Payer: BLUE CROSS/BLUE SHIELD | Admitting: Obstetrics and Gynecology

## 2019-09-06 ENCOUNTER — Ambulatory Visit: Payer: BLUE CROSS/BLUE SHIELD | Admitting: Obstetrics and Gynecology

## 2019-09-29 DIAGNOSIS — Z20828 Contact with and (suspected) exposure to other viral communicable diseases: Secondary | ICD-10-CM | POA: Diagnosis not present

## 2019-09-29 DIAGNOSIS — J00 Acute nasopharyngitis [common cold]: Secondary | ICD-10-CM | POA: Diagnosis not present

## 2019-09-29 DIAGNOSIS — R0982 Postnasal drip: Secondary | ICD-10-CM | POA: Diagnosis not present

## 2019-10-05 DIAGNOSIS — Z20828 Contact with and (suspected) exposure to other viral communicable diseases: Secondary | ICD-10-CM | POA: Diagnosis not present

## 2019-10-05 DIAGNOSIS — Z03818 Encounter for observation for suspected exposure to other biological agents ruled out: Secondary | ICD-10-CM | POA: Diagnosis not present

## 2020-01-25 DIAGNOSIS — Z20828 Contact with and (suspected) exposure to other viral communicable diseases: Secondary | ICD-10-CM | POA: Diagnosis not present

## 2020-05-03 NOTE — Progress Notes (Deleted)
49 y.o. [id number] Divorced White or Caucasian female here for annual exam.      Patient's last menstrual period was 07/26/2014.          Sexually active: {yes no:314532}  The current method of family planning is {contraception:315051}.    Exercising: {yes no:314532}  {types:19826} Smoker:  {YES NO:22349}  Health Maintenance: Pap:  06-10-2017 neg HPV HR neg History of abnormal Pap:  {YES NO:22349} MMG:  *** Colonoscopy:  *** BMD:   *** TDaP:  2020 Gardasil:   *** Covid-19: *** Hep C testing: *** Screening Labs: ***   reports that she has never smoked. She has never used smokeless tobacco. She reports current alcohol use of about 4.0 - 6.0 standard drinks of alcohol per week. She reports that she does not use drugs.  Past Medical History:  Diagnosis Date  . Allergy   . Anemia   . Anxiety   . Dysmenorrhea   . Fibroid   . Heart murmur   . History of mania   . Hormone disorder   . STD (sexually transmitted disease)   . SVT (supraventricular tachycardia) ([place])   . Urinary incontinence     Past Surgical History:  Procedure Laterality Date  . ABDOMINAL HYSTERECTOMY    . APPENDECTOMY    . BREAST REDUCTION SURGERY Bilateral   . BREAST SURGERY    . CESAREAN SECTION    . COSMETIC SURGERY    . CYSTOSCOPY N/A 04/07/2018   Procedure: CYSTOSCOPY;  Surgeon: Salvadore Dom, MD;  Location: [facility];  Service: Gynecology;  Laterality: N/A;  . NASAL SINUS SURGERY    . SVT ABLATION N/A 04/06/2018   Procedure: SVT ABLATION;  Surgeon: Evans Lance, MD;  Location: [facility];  Service: Cardiovascular;  Laterality: N/A;  . TOTAL LAPAROSCOPIC HYSTERECTOMY WITH SALPINGECTOMY Bilateral 04/07/2018   Procedure: TOTAL LAPAROSCOPIC HYSTERECTOMY WITH SALPINGECTOMY;  Surgeon: Salvadore Dom, MD;  Location: [facility];  Service: Gynecology;  Laterality: Bilateral;    Current Outpatient Medications  Medication Sig Dispense Refill  . ALPRAZolam (XANAX) 1 MG tablet Take 1 mg by mouth 4  (four) times daily as needed for anxiety.   5  . amphetamine-dextroamphetamine (ADDERALL) 20 MG tablet Take 20 mg by mouth 3 (three) times daily.    . cetirizine (ZYRTEC) 10 MG tablet Take 10 mg by mouth daily.     Marland Kitchen dicyclomine (BENTYL) 20 MG tablet Take 1 tablet (20 mg total) by mouth 2 (two) times daily as needed for spasms. 30 tablet 0  . estradiol (VIVELLE-DOT) 0.025 MG/24HR Place 1 patch onto the skin 2 (two) times a week. 24 patch 2  . Estradiol 10 MCG TABS vaginal tablet Place 1 tablet (10 mcg total) vaginally 2 (two) times a week. 24 tablet 0  . gabapentin (NEURONTIN) 300 MG capsule Take 1 capsule (300 mg total) by mouth at bedtime. (Patient taking differently: Take 300 mg by mouth at bedtime as needed (sleep, eczema flares). ) 90 capsule 1  . halobetasol (ULTRAVATE) 0.05 % ointment Apply 1 application topically 2 (two) times daily as needed (for outbreaks).   2  . QUEtiapine (SEROQUEL) 50 MG tablet Take 50 mg by mouth at bedtime.     . valACYclovir (VALTREX) 1000 MG tablet Take 1/2 tablet daily for suppression, then 1/2 tablet twice daily for 3 days as needed for outbreaks (Patient taking differently: Take 500 mg by mouth daily as needed (for suppression/outbreaks.). Take 1/2 tablet daily for suppression, then 1/2 tablet  twice daily for 3 days as needed for outbreaks) 30 tablet 12   No current facility-administered medications for this visit.    Family History  Problem Relation Age of Onset  . Liver disease Mother   . Heart attack Maternal Grandfather   . Heart disease Maternal Grandfather     Review of Systems  Exam:   LMP 07/26/2014      General appearance: alert, cooperative and appears stated age, no acute distress Head: Normocephalic, without obvious abnormality Neck: no adenopathy, thyroid {EXAM; THYROID:18604} Lungs: clear to auscultation bilaterally Breasts: {Exam; breast:13139::"normal appearance, no masses or tenderness"} Heart: regular rate and rhythm Abdomen:  soft, non-tender; no masses,  no organomegaly Extremities: extremities normal, no edema Skin: No rashes or lesions Lymph nodes: Cervical, supraclavicular, and axillary nodes normal. No abnormal inguinal nodes palpated Neurologic: Grossly normal   Pelvic: External genitalia:  no lesions              Urethra:  normal appearing urethra with no masses, tenderness or lesions              Bartholins and Skenes: normal                 Vagina: normal appearing vagina, appropriate for age, normal appearing discharge, no lesions              Cervix: neg cervical motion tenderness, no visible lesions             Bimanual Exam:   Uterus:  {exam; uterus:12215}              Adnexa: {exam; adnexa:12223}                 ***, CMA Chaperone was present for exam.  A:  Well Woman with normal exam  P:   Pap :  Mammogram:  Labs:  Medications:

## 2020-05-08 ENCOUNTER — Ambulatory Visit: Payer: BLUE CROSS/BLUE SHIELD | Admitting: Nurse Practitioner

## 2021-01-09 DIAGNOSIS — Z1231 Encounter for screening mammogram for malignant neoplasm of breast: Secondary | ICD-10-CM | POA: Diagnosis not present

## 2021-01-09 DIAGNOSIS — S060X0A Concussion without loss of consciousness, initial encounter: Secondary | ICD-10-CM | POA: Diagnosis not present

## 2021-01-09 DIAGNOSIS — F319 Bipolar disorder, unspecified: Secondary | ICD-10-CM | POA: Diagnosis not present

## 2021-01-09 DIAGNOSIS — R232 Flushing: Secondary | ICD-10-CM | POA: Diagnosis not present

## 2021-02-12 DIAGNOSIS — Z1231 Encounter for screening mammogram for malignant neoplasm of breast: Secondary | ICD-10-CM | POA: Diagnosis not present

## 2021-02-19 DIAGNOSIS — N898 Other specified noninflammatory disorders of vagina: Secondary | ICD-10-CM | POA: Diagnosis not present

## 2021-02-19 DIAGNOSIS — L308 Other specified dermatitis: Secondary | ICD-10-CM | POA: Diagnosis not present

## 2021-02-19 DIAGNOSIS — F319 Bipolar disorder, unspecified: Secondary | ICD-10-CM | POA: Diagnosis not present

## 2021-02-19 DIAGNOSIS — D500 Iron deficiency anemia secondary to blood loss (chronic): Secondary | ICD-10-CM | POA: Diagnosis not present

## 2021-02-19 DIAGNOSIS — Z79899 Other long term (current) drug therapy: Secondary | ICD-10-CM | POA: Diagnosis not present

## 2021-04-25 DIAGNOSIS — Z1211 Encounter for screening for malignant neoplasm of colon: Secondary | ICD-10-CM | POA: Diagnosis not present

## 2021-04-25 DIAGNOSIS — K648 Other hemorrhoids: Secondary | ICD-10-CM | POA: Diagnosis not present

## 2021-07-28 IMAGING — CT CT HEAD WITHOUT CONTRAST
1 series · 16 of 30 positions shown, 20 images · non-contrast
Comparison: None.

CLINICAL DATA: Fall striking head on concrete, pain.

EXAM:
CT HEAD WITHOUT CONTRAST
TECHNIQUE: Contiguous axial images were obtained from the base of the skull
through the vertex without intravenous contrast.

[Series 2: head wo · axial · 0.42mm/px · z∈[+1676,+1821]mm · 16 of 33 slices shown, 20 images]
[im 2/33  brain]
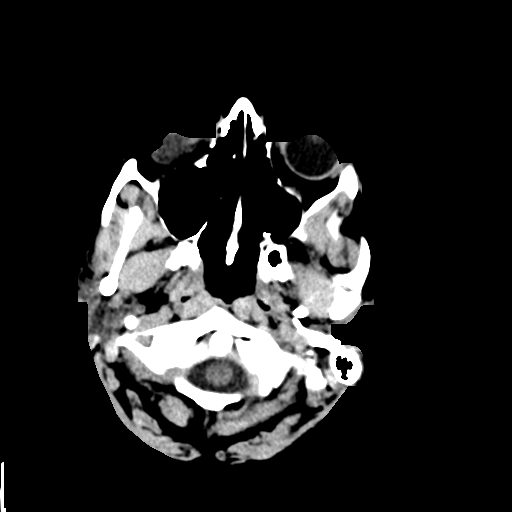
[im 2/33  bone]
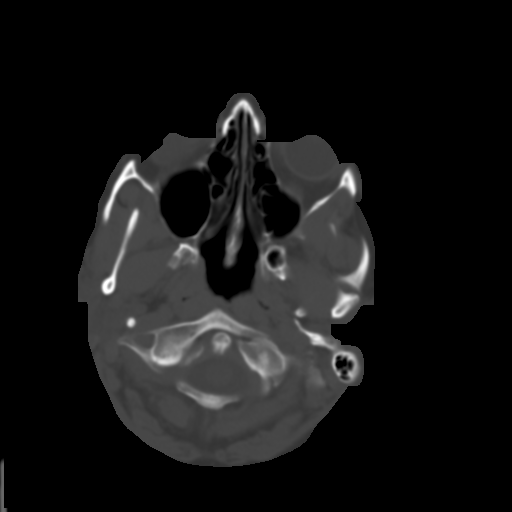
[im 4/33  brain]
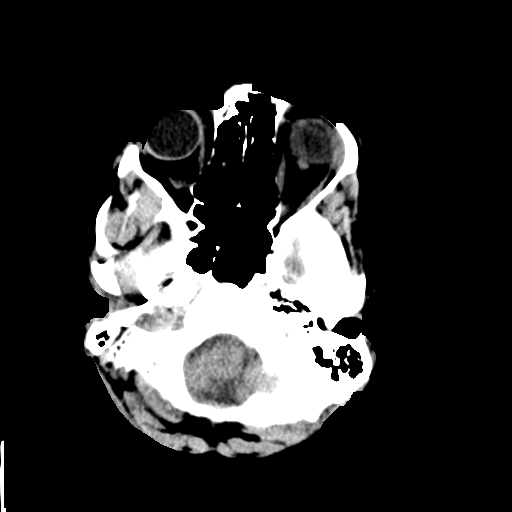
[im 6/33  brain]
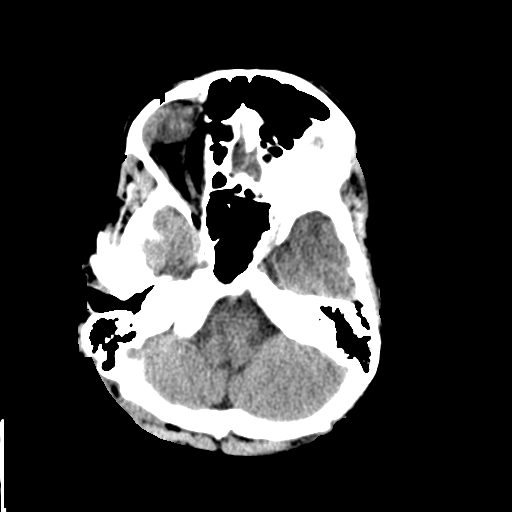
[im 8/33  brain]
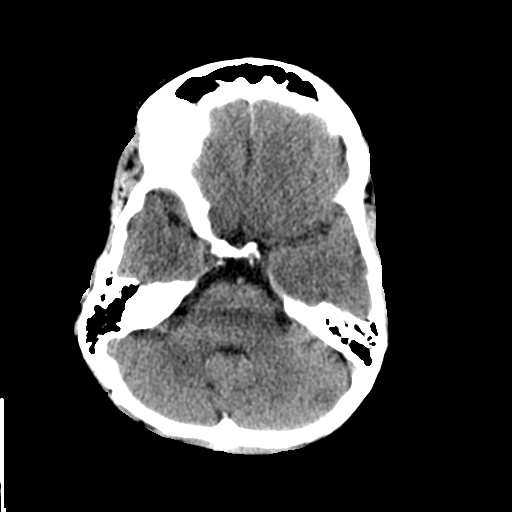
[im 9/33  brain]
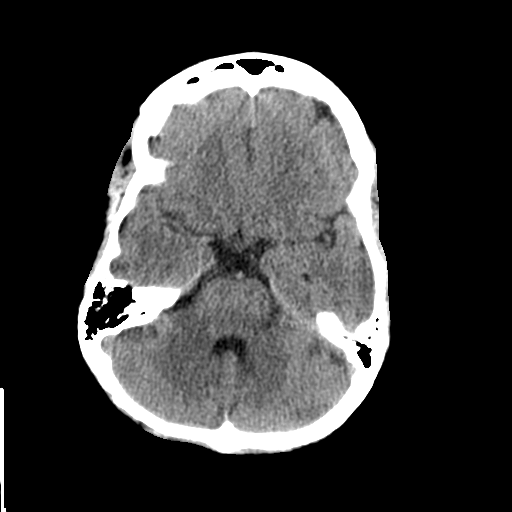
[im 9/33  bone]
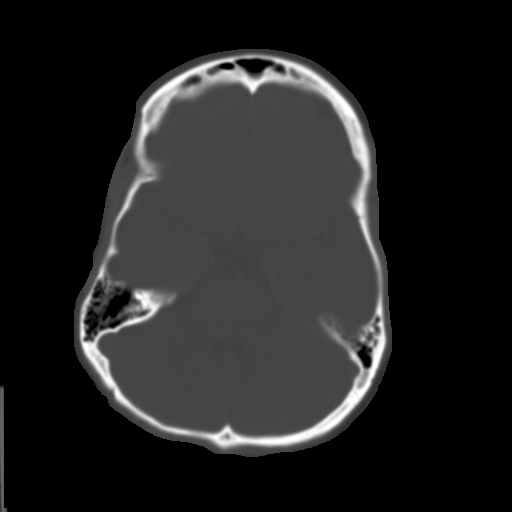
[im 12/33  brain]
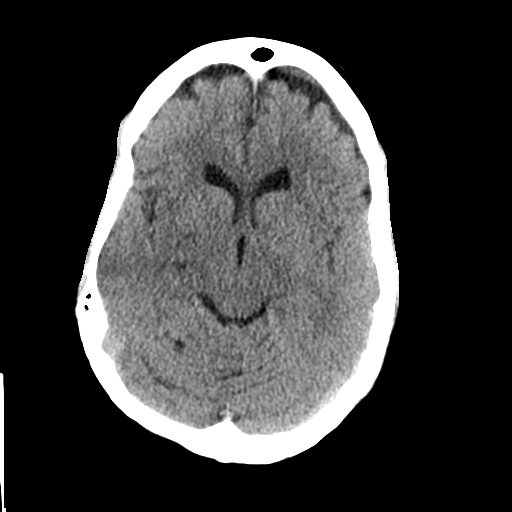
[im 14/33  brain]
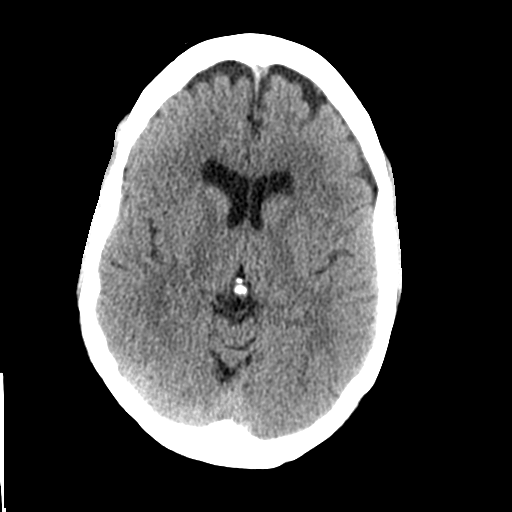
[im 16/33  brain]
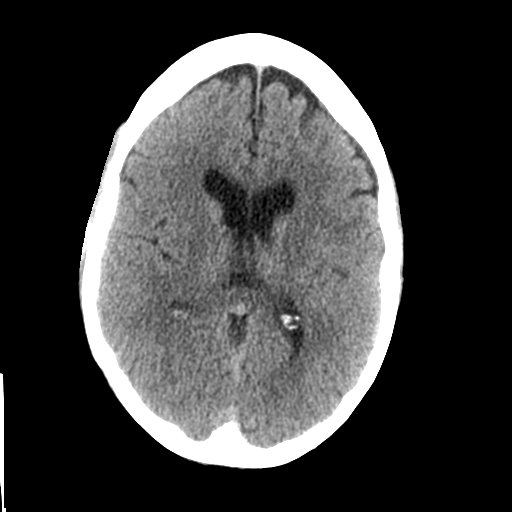
[im 17/33  brain]
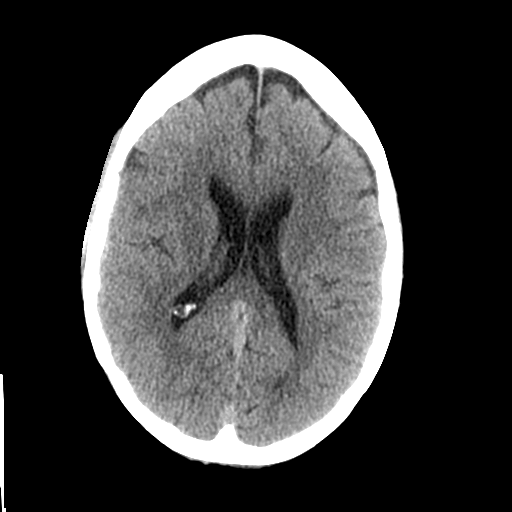
[im 17/33  bone]
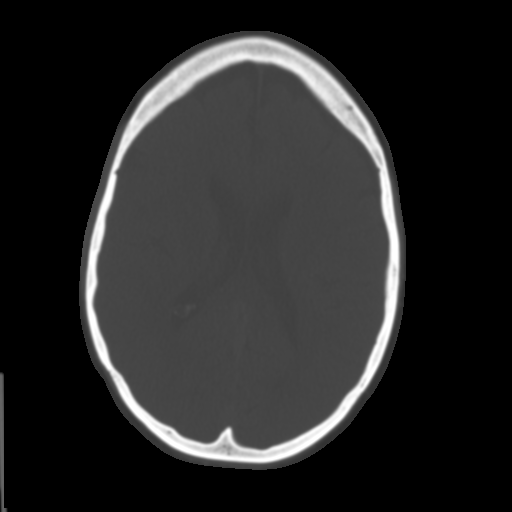
[im 19/33  brain]
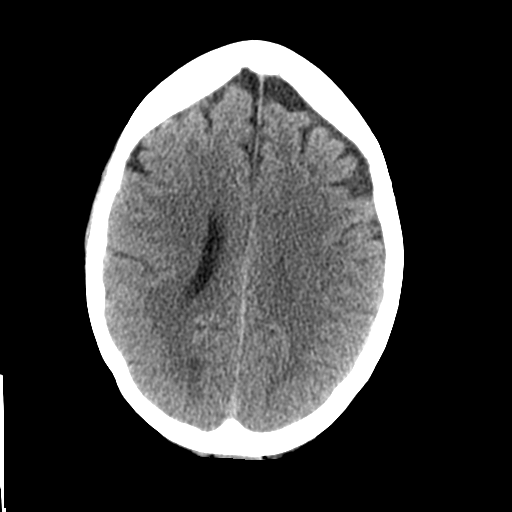
[im 21/33  brain]
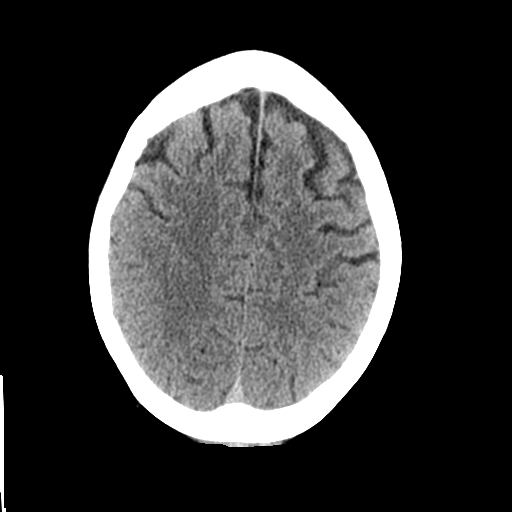
[im 24/33  brain]
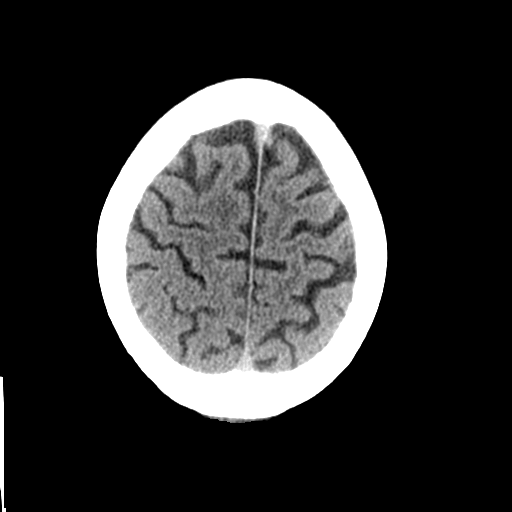
[im 25/33  brain]
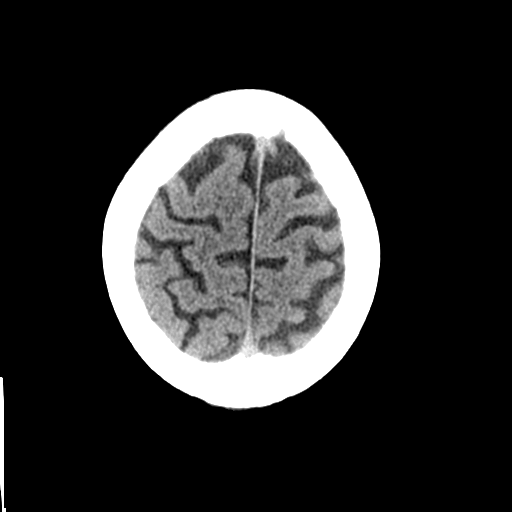
[im 25/33  bone]
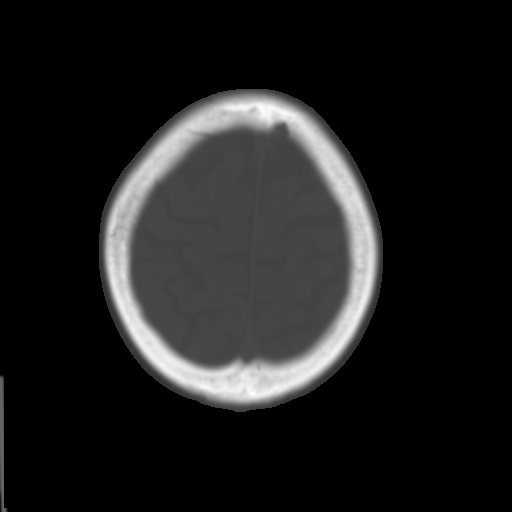
[im 27/33  brain]
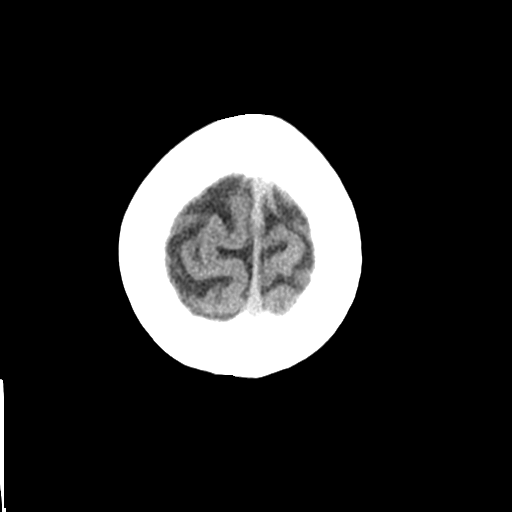
[im 29/33  brain]
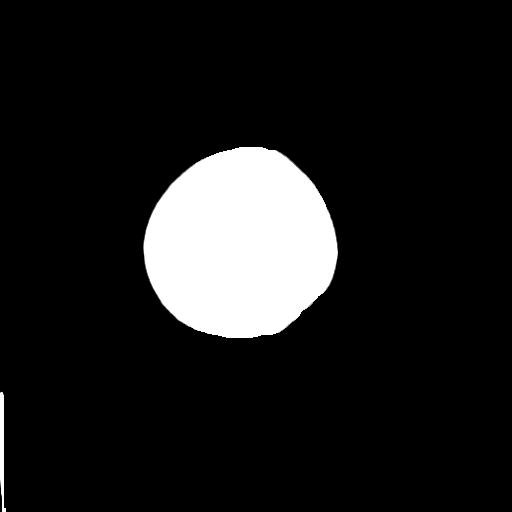
[im 31/33  brain]
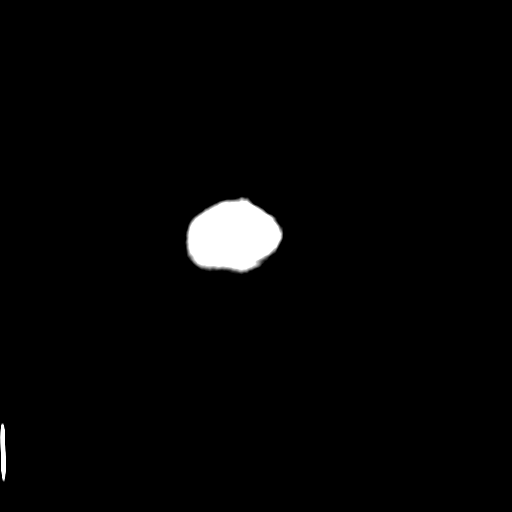

[16 of 30 positions shown; findings below may reference images not displayed]

FINDINGS: Brain: No evidence of acute infarction, hemorrhage, hydrocephalus,
extra-axial collection or mass lesion/mass effect.

Vascular: No hyperdense vessel.

Skull: No fracture or focal lesion.

Sinuses/Orbits: Paranasal sinuses and mastoid air cells are clear.
The visualized orbits are unremarkable.

Other: Posterior scalp hematoma.
IMPRESSION: Posterior scalp hematoma. No skull fracture or acute intracranial
abnormality.

## 2021-10-15 DIAGNOSIS — M79672 Pain in left foot: Secondary | ICD-10-CM | POA: Diagnosis not present

## 2021-11-23 DIAGNOSIS — M79672 Pain in left foot: Secondary | ICD-10-CM | POA: Diagnosis not present

## 2021-11-23 DIAGNOSIS — S92352A Displaced fracture of fifth metatarsal bone, left foot, initial encounter for closed fracture: Secondary | ICD-10-CM | POA: Diagnosis not present

## 2021-12-28 DIAGNOSIS — S92352A Displaced fracture of fifth metatarsal bone, left foot, initial encounter for closed fracture: Secondary | ICD-10-CM | POA: Diagnosis not present

## 2022-02-07 DIAGNOSIS — F3189 Other bipolar disorder: Secondary | ICD-10-CM | POA: Diagnosis not present

## 2022-02-07 DIAGNOSIS — F410 Panic disorder [episodic paroxysmal anxiety] without agoraphobia: Secondary | ICD-10-CM | POA: Diagnosis not present

## 2022-02-07 DIAGNOSIS — F900 Attention-deficit hyperactivity disorder, predominantly inattentive type: Secondary | ICD-10-CM | POA: Diagnosis not present

## 2022-02-15 DIAGNOSIS — S92352A Displaced fracture of fifth metatarsal bone, left foot, initial encounter for closed fracture: Secondary | ICD-10-CM | POA: Diagnosis not present

## 2022-02-15 DIAGNOSIS — S92352D Displaced fracture of fifth metatarsal bone, left foot, subsequent encounter for fracture with routine healing: Secondary | ICD-10-CM | POA: Diagnosis not present

## 2022-08-05 DIAGNOSIS — F900 Attention-deficit hyperactivity disorder, predominantly inattentive type: Secondary | ICD-10-CM | POA: Diagnosis not present

## 2022-08-05 DIAGNOSIS — F410 Panic disorder [episodic paroxysmal anxiety] without agoraphobia: Secondary | ICD-10-CM | POA: Diagnosis not present

## 2022-08-27 ENCOUNTER — Encounter: Payer: Self-pay | Admitting: Family

## 2022-10-09 ENCOUNTER — Encounter: Payer: BC Managed Care – PPO | Admitting: Nurse Practitioner

## 2022-10-11 DIAGNOSIS — R519 Headache, unspecified: Secondary | ICD-10-CM | POA: Diagnosis not present

## 2022-11-13 ENCOUNTER — Encounter: Payer: Self-pay | Admitting: Nurse Practitioner

## 2022-11-13 ENCOUNTER — Ambulatory Visit: Payer: BC Managed Care – PPO | Admitting: Nurse Practitioner

## 2022-11-13 VITALS — BP 134/84 | HR 89 | Ht 66.0 in | Wt 138.0 lb

## 2022-11-13 DIAGNOSIS — M79641 Pain in right hand: Secondary | ICD-10-CM | POA: Diagnosis not present

## 2022-11-13 DIAGNOSIS — N951 Menopausal and female climacteric states: Secondary | ICD-10-CM

## 2022-11-13 DIAGNOSIS — M79642 Pain in left hand: Secondary | ICD-10-CM | POA: Diagnosis not present

## 2022-11-13 DIAGNOSIS — Z01419 Encounter for gynecological examination (general) (routine) without abnormal findings: Secondary | ICD-10-CM

## 2022-11-13 DIAGNOSIS — N760 Acute vaginitis: Secondary | ICD-10-CM | POA: Diagnosis not present

## 2022-11-13 MED ORDER — ESTRADIOL 10 MCG VA TABS
1.0000 | ORAL_TABLET | VAGINAL | 3 refills | Status: DC
Start: 2022-11-14 — End: 2023-12-05

## 2022-11-13 MED ORDER — ESTRADIOL 0.025 MG/24HR TD PTTW
1.0000 | MEDICATED_PATCH | TRANSDERMAL | 3 refills | Status: DC
Start: 2022-11-14 — End: 2023-04-04

## 2022-11-13 MED ORDER — FLUCONAZOLE 150 MG PO TABS
150.0000 mg | ORAL_TABLET | ORAL | 0 refills | Status: AC | PRN
Start: 2022-11-13 — End: ?

## 2022-11-13 NOTE — Progress Notes (Signed)
Lindsey Adams [birth year] [id number]   History:  52 y.o. [id number] presents as newly established patient. S/P 2019 TLH with BS for fibroids. Complains of hot flashes, night sweats and insomnia. Was on HRT in the past and would like to restart. Would also like to restart vaginal estrogen. Sexually active occasionally and it is painful. Normal pap history.   Gynecologic History Patient's last menstrual period was 07/26/2014.   Contraception/Family planning: status post hysterectomy Sexually active: Yes  Health Maintenance Last Pap: 06/10/2017. Results were: Normal neg HPV Last mammogram: 02/12/2021. Results were: Normal Last colonoscopy: 04/25/2021. Results were:  Last Dexa: Not indicated  Past medical history, past surgical history, family history and social history were all reviewed and documented in the EPIC chart. Boyfriend. Works at [employer].   ROS:  A ROS was performed and pertinent positives and negatives are included.  Exam:  Vitals:   11/13/22 1059  BP: 134/84  Pulse: 89  SpO2: 100%  Weight: 138 lb (62.6 kg)  Height: 5\' 6"  (1.676 m)   Body mass index is 22.27 kg/m.  General appearance:  Normal Thyroid:  Symmetrical, normal in size, without palpable masses or nodularity. Respiratory  Auscultation:  Clear without wheezing or rhonchi Cardiovascular  Auscultation:  Regular rate, without rubs, murmurs or gallops  Edema/varicosities:  Not grossly evident Abdominal  Soft,nontender, without masses, guarding or rebound.  Liver/spleen:  No organomegaly noted  Hernia:  None appreciated  Skin  Inspection:  Grossly normal Breasts: Examined lying and sitting.   Right: Without masses, retractions, nipple discharge or axillary adenopathy.   Left: Without masses, retractions, nipple discharge or axillary adenopathy. Genitourinary   Inguinal/mons:  Normal without inguinal adenopathy  External genitalia:  Normal appearing vulva with no masses, tenderness, or  lesions  BUS/Urethra/Skene's glands:  Normal  Vagina:  Normal appearing with normal color and discharge, no lesions  Cervix:  And uterus absent  Adnexa/parametria:     Rt: Normal in size, without masses or tenderness.   Lt: Normal in size, without masses or tenderness.  Anus and perineum: Normal  Digital rectal exam: Deferred  Patient informed chaperone available to be present for breast and pelvic exam. Patient has requested no chaperone to be present. Patient has been advised what will be completed during breast and pelvic exam.   Assessment/Plan:  52 y.o. [id number] to re-establish care.   Well female exam with routine gynecological exam - Education provided on SBEs, importance of preventative screenings, current guidelines, high calcium diet, regular exercise, and multivitamin daily.  Labs with PCP.   Menopausal vaginal dryness - Plan: Estradiol 10 MCG TABS vaginal tablet twice weekly. Recommend oil or silicone lubricants.   Vasomotor symptoms due to menopause - Plan: estradiol (VIVELLE-DOT) 0.025 MG/24HR twice weekly. Aware of risk for breast cancer, blood clots, heart attack, and stroke. Also discussed benefits of symptom management, heart health and bone health.   Recurrent vaginitis - Plan: fluconazole (DIFLUCAN) 150 MG tablet as needed. Works on [employer]. H/O recurrent yeast infections.   Screening for cervical cancer - Normal Pap history.  No longer screening per guidelines.   Screening for breast cancer - Normal mammogram history. Overdue and encouraged to schedule now, especially with HRT and she plans to call today. Normal breast exam today.  Screening for colon cancer - 2022 colonoscopy. Will repeat at GI's recommended interval.  Screening for osteoporosis - Average risk. Will plan DXA at age 19.   Return in 1 year for annual.  [name] DNP, 11:39 AM 11/13/2022

## 2022-12-16 ENCOUNTER — Encounter: Payer: BC Managed Care – PPO | Admitting: Nurse Practitioner

## 2023-01-30 DIAGNOSIS — F5101 Primary insomnia: Secondary | ICD-10-CM | POA: Diagnosis not present

## 2023-01-30 DIAGNOSIS — F900 Attention-deficit hyperactivity disorder, predominantly inattentive type: Secondary | ICD-10-CM | POA: Diagnosis not present

## 2023-01-30 DIAGNOSIS — F3189 Other bipolar disorder: Secondary | ICD-10-CM | POA: Diagnosis not present

## 2023-04-04 ENCOUNTER — Other Ambulatory Visit: Payer: Self-pay

## 2023-04-04 DIAGNOSIS — N951 Menopausal and female climacteric states: Secondary | ICD-10-CM

## 2023-04-04 MED ORDER — ESTRADIOL 0.025 MG/24HR TD PTTW
1.0000 | MEDICATED_PATCH | TRANSDERMAL | 1 refills | Status: AC
Start: 2023-04-07 — End: ?

## 2023-04-04 NOTE — Telephone Encounter (Signed)
Medication refill request: estradiol patch  Last AEX:  11/13/22 Next AEX: not scheduled  Last MMG (if hormonal medication request): 02/12/21 birads 2 benign  Refill authorized: patient never picked up rx when it was sent in. She is using the vaginal tablet but is having pain with sex.

## 2023-07-21 DIAGNOSIS — F3189 Other bipolar disorder: Secondary | ICD-10-CM | POA: Diagnosis not present

## 2023-07-21 DIAGNOSIS — F5101 Primary insomnia: Secondary | ICD-10-CM | POA: Diagnosis not present

## 2023-07-21 DIAGNOSIS — F900 Attention-deficit hyperactivity disorder, predominantly inattentive type: Secondary | ICD-10-CM | POA: Diagnosis not present

## 2023-10-10 DIAGNOSIS — B356 Tinea cruris: Secondary | ICD-10-CM | POA: Diagnosis not present

## 2023-10-10 DIAGNOSIS — L309 Dermatitis, unspecified: Secondary | ICD-10-CM | POA: Diagnosis not present

## 2023-10-10 DIAGNOSIS — Z1231 Encounter for screening mammogram for malignant neoplasm of breast: Secondary | ICD-10-CM | POA: Diagnosis not present

## 2023-10-10 DIAGNOSIS — M62838 Other muscle spasm: Secondary | ICD-10-CM | POA: Diagnosis not present

## 2023-10-10 DIAGNOSIS — B009 Herpesviral infection, unspecified: Secondary | ICD-10-CM | POA: Diagnosis not present

## 2023-12-05 ENCOUNTER — Other Ambulatory Visit: Payer: Self-pay

## 2023-12-05 DIAGNOSIS — N951 Menopausal and female climacteric states: Secondary | ICD-10-CM

## 2023-12-05 MED ORDER — ESTRADIOL 10 MCG VA TABS
1.0000 | ORAL_TABLET | VAGINAL | 0 refills | Status: AC
Start: 2023-12-08 — End: ?

## 2023-12-05 NOTE — Telephone Encounter (Signed)
 Medication refill request: estradiol  10mcg vaginal tablet Last AEX:  11-13-22 Next AEX: not scheduled. Message sent to scheduling department Last MMG (if hormonal medication request): 02-12-21 birads 2:neg Refill authorized: please approve or deny as appropriate

## 2024-01-29 DIAGNOSIS — F5101 Primary insomnia: Secondary | ICD-10-CM | POA: Diagnosis not present

## 2024-01-29 DIAGNOSIS — F3189 Other bipolar disorder: Secondary | ICD-10-CM | POA: Diagnosis not present

## 2024-01-29 DIAGNOSIS — F900 Attention-deficit hyperactivity disorder, predominantly inattentive type: Secondary | ICD-10-CM | POA: Diagnosis not present

## 2024-02-17 ENCOUNTER — Ambulatory Visit: Admitting: Nurse Practitioner

## 2024-02-17 NOTE — Progress Notes (Deleted)
 Lindsey Adams March 10, 1971 [id number]   History:  53 y.o. [id number] presents for annual exam. S/P 2019 TLH with BS for fibroids. Complains of hot flashes, night sweats and insomnia. Was on HRT in the past and would like to restart. Would also like to restart vaginal estrogen. Sexually active occasionally and it is painful. Normal pap history.   Gynecologic History Patient's last menstrual period was 07/26/2014.   Contraception/Family planning: status post hysterectomy Sexually active: Yes  Health Maintenance Last Pap: 06/10/2017. Results were: Normal neg HPV Last mammogram: 02/12/2021. Results were: Normal Last colonoscopy: 04/25/2021. Results were:  Last Dexa: Not indicated     10/21/2017   10:16 AM  Depression screen PHQ 2/9  Decreased Interest 0  Down, Depressed, Hopeless 0  PHQ - 2 Score 0     Past medical history, past surgical history, family history and social history were all reviewed and documented in the EPIC chart. Lindsey Adams. Works at [employer].   ROS:  A ROS was performed and pertinent positives and negatives are included.  Exam:  There were no vitals filed for this visit.  There is no height or weight on file to calculate BMI.  General appearance:  Normal Thyroid :  Symmetrical, normal in size, without palpable masses or nodularity. Respiratory  Auscultation:  Clear without wheezing or rhonchi Cardiovascular  Auscultation:  Regular rate, without rubs, murmurs or gallops  Edema/varicosities:  Not grossly evident Abdominal  Soft,nontender, without masses, guarding or rebound.  Liver/spleen:  No organomegaly noted  Hernia:  None appreciated  Skin  Inspection:  Grossly normal Breasts: Examined lying and sitting.   Right: Without masses, retractions, nipple discharge or axillary adenopathy.   Left: Without masses, retractions, nipple discharge or axillary adenopathy. Pelvic: External genitalia:  no lesions              Urethra:  normal appearing  urethra with no masses, tenderness or lesions              Bartholins and Skenes: normal                 Vagina: normal appearing vagina with normal color and discharge, no lesions              Cervix: absent Bimanual Exam:  Uterus: absent              Adnexa: no mass, fullness, tenderness              Rectovaginal: Deferred              Anus:  normal, no lesions   Assessment/Plan:  53 y.o. [id number] to re-establish care.   Well female exam with routine gynecological exam - Education provided on SBEs, importance of preventative screenings, current guidelines, high calcium diet, regular exercise, and multivitamin daily.  Labs with PCP.   Menopausal vaginal dryness - Plan: Estradiol  10 MCG TABS vaginal tablet twice weekly. Recommend oil or silicone lubricants.   Vasomotor symptoms due to menopause - Plan: estradiol  (VIVELLE -DOT) 0.025 MG/24HR twice weekly. Aware of risk for breast cancer, blood clots, heart attack, and stroke. Also discussed benefits of symptom management, heart health and bone health.   Recurrent vaginitis - Plan: fluconazole  (DIFLUCAN ) 150 MG tablet as needed. Works on [employer]. H/O recurrent yeast infections.   Screening for cervical cancer - Normal Pap history.  No longer screening per guidelines.   Screening for breast cancer - Normal mammogram history. Overdue and encouraged to schedule now, especially with HRT  and she plans to call today. Normal breast exam today.  Screening for colon cancer - 2022 colonoscopy. Will repeat at GI's recommended interval.  Screening for osteoporosis - Average risk. Will plan DXA at age 66.   No follow-ups on file.     Lindsey DELENA Shutter DNP, 8:45 AM 02/17/2024
